# Patient Record
Sex: Male | Born: 1955 | Race: White | Hispanic: No | Marital: Married | Smoking: Former smoker
Health system: Southern US, Community
[De-identification: ages and names within clinical notes are randomized; demographics above are authoritative.]

## PROBLEM LIST (undated history)

## (undated) DIAGNOSIS — I422 Other hypertrophic cardiomyopathy: Secondary | ICD-10-CM

## (undated) DIAGNOSIS — K219 Gastro-esophageal reflux disease without esophagitis: Secondary | ICD-10-CM

## (undated) DIAGNOSIS — G4700 Insomnia, unspecified: Secondary | ICD-10-CM

## (undated) DIAGNOSIS — I10 Essential (primary) hypertension: Secondary | ICD-10-CM

## (undated) DIAGNOSIS — I499 Cardiac arrhythmia, unspecified: Secondary | ICD-10-CM

## (undated) DIAGNOSIS — M1990 Unspecified osteoarthritis, unspecified site: Secondary | ICD-10-CM

## (undated) HISTORY — PX: OTHER SURGICAL HISTORY: SHX169

## (undated) HISTORY — PX: BACK SURGERY: SHX140

## (undated) HISTORY — PX: KNEE ARTHROSCOPY: SUR90

---

## 2016-12-23 DIAGNOSIS — M5442 Lumbago with sciatica, left side: Secondary | ICD-10-CM | POA: Insufficient documentation

## 2016-12-23 DIAGNOSIS — I429 Cardiomyopathy, unspecified: Secondary | ICD-10-CM | POA: Insufficient documentation

## 2016-12-23 DIAGNOSIS — I10 Essential (primary) hypertension: Secondary | ICD-10-CM | POA: Insufficient documentation

## 2016-12-23 DIAGNOSIS — G8929 Other chronic pain: Secondary | ICD-10-CM | POA: Insufficient documentation

## 2017-01-05 ENCOUNTER — Ambulatory Visit: Payer: Self-pay | Admitting: Family Medicine

## 2017-03-30 DIAGNOSIS — R0602 Shortness of breath: Secondary | ICD-10-CM | POA: Insufficient documentation

## 2017-03-30 DIAGNOSIS — R079 Chest pain, unspecified: Secondary | ICD-10-CM

## 2017-03-30 DIAGNOSIS — R55 Syncope and collapse: Secondary | ICD-10-CM | POA: Insufficient documentation

## 2017-06-17 ENCOUNTER — Other Ambulatory Visit: Payer: Self-pay | Admitting: Student

## 2017-06-17 DIAGNOSIS — M48062 Spinal stenosis, lumbar region with neurogenic claudication: Secondary | ICD-10-CM

## 2017-06-17 DIAGNOSIS — G9519 Other vascular myelopathies: Secondary | ICD-10-CM

## 2017-06-21 ENCOUNTER — Ambulatory Visit: Payer: BLUE CROSS/BLUE SHIELD

## 2017-09-06 DIAGNOSIS — K635 Polyp of colon: Secondary | ICD-10-CM | POA: Insufficient documentation

## 2017-11-03 ENCOUNTER — Other Ambulatory Visit: Payer: Self-pay

## 2017-11-03 ENCOUNTER — Encounter
Admission: RE | Admit: 2017-11-03 | Discharge: 2017-11-03 | Disposition: A | Discharge status: Home / Self Care | Payer: BLUE CROSS/BLUE SHIELD | Source: Ambulatory Visit | Attending: Neurosurgery | Admitting: Neurosurgery

## 2017-11-03 DIAGNOSIS — Z01810 Encounter for preprocedural cardiovascular examination: Secondary | ICD-10-CM | POA: Insufficient documentation

## 2017-11-03 DIAGNOSIS — R001 Bradycardia, unspecified: Secondary | ICD-10-CM | POA: Insufficient documentation

## 2017-11-03 DIAGNOSIS — R9431 Abnormal electrocardiogram [ECG] [EKG]: Secondary | ICD-10-CM | POA: Insufficient documentation

## 2017-11-03 DIAGNOSIS — I4510 Unspecified right bundle-branch block: Secondary | ICD-10-CM | POA: Diagnosis not present

## 2017-11-03 DIAGNOSIS — Z01812 Encounter for preprocedural laboratory examination: Secondary | ICD-10-CM | POA: Diagnosis present

## 2017-11-03 HISTORY — DX: Cardiac arrhythmia, unspecified: I49.9

## 2017-11-03 HISTORY — DX: Unspecified osteoarthritis, unspecified site: M19.90

## 2017-11-03 HISTORY — DX: Gastro-esophageal reflux disease without esophagitis: K21.9

## 2017-11-03 HISTORY — DX: Insomnia, unspecified: G47.00

## 2017-11-03 HISTORY — DX: Essential (primary) hypertension: I10

## 2017-11-03 HISTORY — DX: Other hypertrophic cardiomyopathy: I42.2

## 2017-11-03 LAB — URINALYSIS, ROUTINE W REFLEX MICROSCOPIC
BACTERIA UA: NONE SEEN
BILIRUBIN URINE: NEGATIVE
Glucose, UA: NEGATIVE mg/dL
HGB URINE DIPSTICK: NEGATIVE
KETONES UR: NEGATIVE mg/dL
LEUKOCYTES UA: NEGATIVE
Nitrite: NEGATIVE
PH: 5 (ref 5.0–8.0)
PROTEIN: 30 mg/dL — AB
SQUAMOUS EPITHELIAL / LPF: NONE SEEN (ref 0–5)
Specific Gravity, Urine: 1.021 (ref 1.005–1.030)

## 2017-11-03 LAB — BASIC METABOLIC PANEL
Anion gap: 8 (ref 5–15)
BUN: 18 mg/dL (ref 8–23)
CALCIUM: 9.5 mg/dL (ref 8.9–10.3)
CO2: 25 mmol/L (ref 22–32)
CREATININE: 1.21 mg/dL (ref 0.61–1.24)
Chloride: 106 mmol/L (ref 98–111)
GFR calc Af Amer: >60 mL/min (ref >60)
GFR calc non Af Amer: >60 mL/min (ref >60)
Glucose, Bld: 129 mg/dL — ABNORMAL HIGH (ref 70–99)
Potassium: 3.9 mmol/L (ref 3.5–5.1)
Sodium: 139 mmol/L (ref 135–145)

## 2017-11-03 LAB — TYPE AND SCREEN
ABO/RH(D): A POS
Antibody Screen: NEGATIVE

## 2017-11-03 LAB — PROTIME-INR
INR: 1.06
Prothrombin Time: 13.7 seconds (ref 11.4–15.2)

## 2017-11-03 LAB — CBC
HEMATOCRIT: 41.8 % (ref 40.0–52.0)
Hemoglobin: 14.1 g/dL (ref 13.0–18.0)
MCH: 30.6 pg (ref 26.0–34.0)
MCHC: 33.8 g/dL (ref 32.0–36.0)
MCV: 90.5 fL (ref 80.0–100.0)
Platelets: 325 10*3/uL (ref 150–440)
RBC: 4.62 MIL/uL (ref 4.40–5.90)
RDW: 15.2 % — AB (ref 11.5–14.5)
WBC: 6.1 10*3/uL (ref 3.8–10.6)

## 2017-11-03 LAB — APTT: aPTT: 25 seconds (ref 24–36)

## 2017-11-03 NOTE — Patient Instructions (Signed)
Your procedure is scheduled on:Wed 9/18 Report to Day Surgery. To find out your arrival time please call [phone] between 1PM - 3PM on Tues 9/17.  Remember: Instructions that are not followed completely may result in serious medical risk,  up to and including death, or upon the discretion of your surgeon and anesthesiologist your  surgery may need to be rescheduled.     _X__ 1. Do not eat food after midnight the night before your procedure.                 No gum chewing or hard candies. You may drink clear liquids up to 2 hours                 before you are scheduled to arrive for your surgery- DO not drink clear                 liquids within 2 hours of the start of your surgery.                 Clear Liquids include:  water, apple juice without pulp, clear carbohydrate                 drink such as Clearfast of Gatorade, Black Coffee or Tea (Do not add                 anything to coffee or tea).  __X__2.  On the morning of surgery brush your teeth with toothpaste and water, you                may rinse your mouth with mouthwash if you wish.  Do not swallow any toothpaste of mouthwash.     _X__ 3.  No Alcohol for 24 hours before or after surgery.   ___ 4.  Do Not Smoke or use e-cigarettes For 24 Hours Prior to Your Surgery.                 Do not use any chewable tobacco products for at least 6 hours prior to                 surgery.  ____  5.  Bring all medications with you on the day of surgery if instructed.   __x__  6.  Notify your doctor if there is any change in your medical condition      (cold, fever, infections).     Do not wear jewelry, make-up, hairpins, clips or nail polish. Do not wear lotions, powders, or perfumes. You may wear deodorant. Do not shave 48 hours prior to surgery. Men may shave face and neck. Do not bring valuables to the hospital.    [facility] is not responsible for any belongings or valuables.  Contacts, dentures or  bridgework may not be worn into surgery. Leave your suitcase in the car. After surgery it may be brought to your room. For patients admitted to the hospital, discharge time is determined by your treatment team.   Patients discharged the day of surgery will not be allowed to drive home.   Please read over the following fact sheets that you were given:    __x__ Take these medicines the morning of surgery with A SIP OF WATER:    1. DULoxetine (CYMBALTA) 60 MG capsule  2. timolol (TIMOPTIC) 0.25 % ophthalmic solution  3. metoprolol succinate (TOPROL-XL) 100 MG 24 hr tablet  4.verapamil (CALAN-SR) 180 MG CR tablet  5.  6.  ____  Fleet Enema (as directed)   _x___ Use CHG Soap as directed  ____ Use inhalers on the day of surgery  ____ Stop metformin 2 days prior to surgery    ____ Take 1/2 of usual insulin dose the night before surgery. No insulin the morning          of surgery.   ____ Stop Coumadin/Plavix/aspirin on   _x___ Stop Anti-inflammatories  aspirin-acetaminophen-caffeine (EXCEDRIN MIGRAINE) 250-250-65 MG tablet, ibuprofen and Aleve today   __x__ Stop supplements until after surgery.  Krill oil  ____ Bring C-Pap to the hospital.

## 2017-11-09 ENCOUNTER — Ambulatory Visit: Payer: BLUE CROSS/BLUE SHIELD

## 2017-11-09 ENCOUNTER — Ambulatory Visit: Payer: BLUE CROSS/BLUE SHIELD | Admitting: Anesthesiology

## 2017-11-09 ENCOUNTER — Encounter: Payer: Self-pay | Admitting: *Deleted

## 2017-11-09 ENCOUNTER — Encounter
Admission: RE | Disposition: A | Discharge status: Home / Self Care | Payer: Self-pay | Source: Ambulatory Visit | Attending: Neurosurgery

## 2017-11-09 ENCOUNTER — Ambulatory Visit
Admission: RE | Admit: 2017-11-09 | Discharge: 2017-11-09 | Disposition: A | Discharge status: Home / Self Care | Payer: BLUE CROSS/BLUE SHIELD | Source: Ambulatory Visit | Attending: Neurosurgery | Admitting: Neurosurgery

## 2017-11-09 DIAGNOSIS — I428 Other cardiomyopathies: Secondary | ICD-10-CM | POA: Insufficient documentation

## 2017-11-09 DIAGNOSIS — M48062 Spinal stenosis, lumbar region with neurogenic claudication: Secondary | ICD-10-CM | POA: Insufficient documentation

## 2017-11-09 DIAGNOSIS — Z8249 Family history of ischemic heart disease and other diseases of the circulatory system: Secondary | ICD-10-CM | POA: Diagnosis not present

## 2017-11-09 DIAGNOSIS — Z79899 Other long term (current) drug therapy: Secondary | ICD-10-CM | POA: Insufficient documentation

## 2017-11-09 DIAGNOSIS — Z820 Family history of epilepsy and other diseases of the nervous system: Secondary | ICD-10-CM | POA: Insufficient documentation

## 2017-11-09 DIAGNOSIS — K219 Gastro-esophageal reflux disease without esophagitis: Secondary | ICD-10-CM | POA: Insufficient documentation

## 2017-11-09 DIAGNOSIS — E785 Hyperlipidemia, unspecified: Secondary | ICD-10-CM | POA: Diagnosis not present

## 2017-11-09 DIAGNOSIS — M810 Age-related osteoporosis without current pathological fracture: Secondary | ICD-10-CM | POA: Insufficient documentation

## 2017-11-09 DIAGNOSIS — G4700 Insomnia, unspecified: Secondary | ICD-10-CM | POA: Diagnosis not present

## 2017-11-09 DIAGNOSIS — M5416 Radiculopathy, lumbar region: Secondary | ICD-10-CM | POA: Diagnosis not present

## 2017-11-09 DIAGNOSIS — Z87891 Personal history of nicotine dependence: Secondary | ICD-10-CM | POA: Diagnosis not present

## 2017-11-09 DIAGNOSIS — Z809 Family history of malignant neoplasm, unspecified: Secondary | ICD-10-CM | POA: Diagnosis not present

## 2017-11-09 DIAGNOSIS — H409 Unspecified glaucoma: Secondary | ICD-10-CM | POA: Insufficient documentation

## 2017-11-09 DIAGNOSIS — M1990 Unspecified osteoarthritis, unspecified site: Secondary | ICD-10-CM | POA: Diagnosis not present

## 2017-11-09 DIAGNOSIS — I10 Essential (primary) hypertension: Secondary | ICD-10-CM | POA: Diagnosis not present

## 2017-11-09 DIAGNOSIS — Z419 Encounter for procedure for purposes other than remedying health state, unspecified: Secondary | ICD-10-CM

## 2017-11-09 DIAGNOSIS — Z800 Family history of malignant neoplasm of digestive organs: Secondary | ICD-10-CM | POA: Diagnosis not present

## 2017-11-09 DIAGNOSIS — Z823 Family history of stroke: Secondary | ICD-10-CM | POA: Insufficient documentation

## 2017-11-09 HISTORY — PX: LUMBAR LAMINECTOMY/DECOMPRESSION MICRODISCECTOMY: SHX5026

## 2017-11-09 LAB — ABO/RH: ABO/RH(D): A POS

## 2017-11-09 SURGERY — LUMBAR LAMINECTOMY/DECOMPRESSION MICRODISCECTOMY 1 LEVEL
Anesthesia: General | Site: Back

## 2017-11-09 MED ORDER — PROPOFOL 10 MG/ML IV BOLUS
INTRAVENOUS | Status: AC
  Filled 2017-11-09: qty 20

## 2017-11-09 MED ORDER — METHYLPREDNISOLONE ACETATE 40 MG/ML IJ SUSP
INTRAMUSCULAR | Status: DC | PRN
  Administered 2017-11-09: 40 mg

## 2017-11-09 MED ORDER — FAMOTIDINE 20 MG PO TABS
ORAL_TABLET | ORAL | Status: AC
  Administered 2017-11-09: 20 mg via ORAL
  Filled 2017-11-09: qty 1

## 2017-11-09 MED ORDER — LIDOCAINE HCL (PF) 2 % IJ SOLN
INTRAMUSCULAR | Status: AC
  Filled 2017-11-09: qty 10

## 2017-11-09 MED ORDER — FENTANYL CITRATE (PF) 250 MCG/5ML IJ SOLN
INTRAMUSCULAR | Status: AC
  Filled 2017-11-09: qty 5

## 2017-11-09 MED ORDER — ONDANSETRON HCL 4 MG/2ML IJ SOLN
INTRAMUSCULAR | Status: DC | PRN
  Administered 2017-11-09: 4 mg via INTRAVENOUS

## 2017-11-09 MED ORDER — LIDOCAINE HCL (CARDIAC) PF 100 MG/5ML IV SOSY
PREFILLED_SYRINGE | INTRAVENOUS | Status: DC | PRN
  Administered 2017-11-09: 60 mg via INTRAVENOUS

## 2017-11-09 MED ORDER — METHOCARBAMOL 500 MG PO TABS: 500.0000 mg | ORAL_TABLET | Freq: Four times a day (QID) | ORAL | 0 refills | Status: DC | PRN

## 2017-11-09 MED ORDER — THROMBIN 5000 UNITS EX SOLR
CUTANEOUS | Status: DC | PRN
  Administered 2017-11-09: 5000 [IU] via TOPICAL

## 2017-11-09 MED ORDER — CEFAZOLIN SODIUM-DEXTROSE 2-4 GM/100ML-% IV SOLN
INTRAVENOUS | Status: AC
  Filled 2017-11-09: qty 100

## 2017-11-09 MED ORDER — ONDANSETRON HCL 4 MG/2ML IJ SOLN
INTRAMUSCULAR | Status: AC
  Filled 2017-11-09: qty 2

## 2017-11-09 MED ORDER — FENTANYL CITRATE (PF) 100 MCG/2ML IJ SOLN
INTRAMUSCULAR | Status: DC | PRN
  Administered 2017-11-09: 50 ug via INTRAVENOUS
  Administered 2017-11-09: 100 ug via INTRAVENOUS
  Administered 2017-11-09: 50 ug via INTRAVENOUS

## 2017-11-09 MED ORDER — OXYCODONE HCL 5 MG PO TABS: 5.0000 mg | ORAL_TABLET | ORAL | 0 refills | Status: DC | PRN

## 2017-11-09 MED ORDER — CEFAZOLIN SODIUM-DEXTROSE 2-4 GM/100ML-% IV SOLN: 2.0000 g | Freq: Once | INTRAVENOUS | Status: DC

## 2017-11-09 MED ORDER — BUPIVACAINE HCL 0.5 % IJ SOLN
INTRAMUSCULAR | Status: DC | PRN
  Administered 2017-11-09: 20 mL

## 2017-11-09 MED ORDER — FENTANYL CITRATE (PF) 100 MCG/2ML IJ SOLN: 25.0000 ug | INTRAMUSCULAR | Status: DC | PRN

## 2017-11-09 MED ORDER — SODIUM CHLORIDE 0.9 % IV SOLN
INTRAVENOUS | Status: DC | PRN
  Administered 2017-11-09: 40 mL

## 2017-11-09 MED ORDER — SUCCINYLCHOLINE CHLORIDE 20 MG/ML IJ SOLN
INTRAMUSCULAR | Status: DC | PRN
  Administered 2017-11-09: 100 mg via INTRAVENOUS

## 2017-11-09 MED ORDER — LACTATED RINGERS IV SOLN
INTRAVENOUS | Status: DC
  Administered 2017-11-09: 12:00:00 via INTRAVENOUS

## 2017-11-09 MED ORDER — DEXAMETHASONE SODIUM PHOSPHATE 10 MG/ML IJ SOLN
INTRAMUSCULAR | Status: AC
  Filled 2017-11-09: qty 1

## 2017-11-09 MED ORDER — FAMOTIDINE 20 MG PO TABS
20.0000 mg | ORAL_TABLET | Freq: Once | ORAL | Status: AC
  Administered 2017-11-09: 20 mg via ORAL

## 2017-11-09 MED ORDER — BUPIVACAINE-EPINEPHRINE (PF) 0.5% -1:200000 IJ SOLN
INTRAMUSCULAR | Status: DC | PRN
  Administered 2017-11-09: 7 mL

## 2017-11-09 MED ORDER — GLYCOPYRROLATE 0.2 MG/ML IJ SOLN
INTRAMUSCULAR | Status: DC | PRN
  Administered 2017-11-09: 0.2 mg via INTRAVENOUS

## 2017-11-09 MED ORDER — OXYCODONE HCL 5 MG PO TABS: 5.0000 mg | ORAL_TABLET | Freq: Once | ORAL | Status: DC | PRN

## 2017-11-09 MED ORDER — OXYCODONE HCL 5 MG/5ML PO SOLN: 5.0000 mg | Freq: Once | ORAL | Status: DC | PRN

## 2017-11-09 MED ORDER — MIDAZOLAM HCL 2 MG/2ML IJ SOLN
INTRAMUSCULAR | Status: AC
  Filled 2017-11-09: qty 2

## 2017-11-09 MED ORDER — PHENYLEPHRINE HCL 10 MG/ML IJ SOLN
INTRAMUSCULAR | Status: DC | PRN
  Administered 2017-11-09 (×2): 50 ug via INTRAVENOUS
  Administered 2017-11-09 (×2): 100 ug via INTRAVENOUS

## 2017-11-09 MED ORDER — DEXAMETHASONE SODIUM PHOSPHATE 10 MG/ML IJ SOLN
INTRAMUSCULAR | Status: DC | PRN
  Administered 2017-11-09: 8 mg via INTRAVENOUS

## 2017-11-09 MED ORDER — SODIUM CHLORIDE 0.9 % IR SOLN
Status: DC | PRN
  Administered 2017-11-09: 1000 mL

## 2017-11-09 MED ORDER — SODIUM CHLORIDE 0.9 % IV SOLN
INTRAVENOUS | Status: DC | PRN
  Administered 2017-11-09: 30 ug/min via INTRAVENOUS

## 2017-11-09 MED ORDER — PROPOFOL 10 MG/ML IV BOLUS
INTRAVENOUS | Status: DC | PRN
  Administered 2017-11-09: 150 mg via INTRAVENOUS

## 2017-11-09 SURGICAL SUPPLY — 54 items
BUR NEURO DRILL SOFT 3.0X3.8M (BURR) ×2 IMPLANT
CANISTER SUCT 1200ML W/VALVE (MISCELLANEOUS) ×4 IMPLANT
CHLORAPREP W/TINT 26ML (MISCELLANEOUS) ×4 IMPLANT
CNTNR SPEC 2.5X3XGRAD LEK (MISCELLANEOUS) ×1
CONT SPEC 4OZ STER OR WHT (MISCELLANEOUS) ×1
CONTAINER SPEC 2.5X3XGRAD LEK (MISCELLANEOUS) ×1 IMPLANT
COUNTER NEEDLE 20/40 LG (NEEDLE) ×2 IMPLANT
COVER LIGHT HANDLE STERIS (MISCELLANEOUS) ×4 IMPLANT
CUP MEDICINE 2OZ PLAST GRAD ST (MISCELLANEOUS) ×4 IMPLANT
DERMABOND ADVANCED (GAUZE/BANDAGES/DRESSINGS) ×1
DERMABOND ADVANCED .7 DNX12 (GAUZE/BANDAGES/DRESSINGS) ×1 IMPLANT
DRAPE C-ARM 42X72 X-RAY (DRAPES) ×4 IMPLANT
DRAPE LAPAROTOMY 100X77 ABD (DRAPES) ×2 IMPLANT
DRAPE MICROSCOPE SPINE 48X150 (DRAPES) ×2 IMPLANT
DRAPE POUCH INSTRU U-SHP 10X18 (DRAPES) ×2 IMPLANT
DRAPE SURG 17X11 SM STRL (DRAPES) ×8 IMPLANT
ELECT CAUTERY BLADE TIP 2.5 (TIP) ×2
ELECT EZSTD 165MM 6.5IN (MISCELLANEOUS)
ELECT REM PT RETURN 9FT ADLT (ELECTROSURGICAL) ×2
ELECTRODE CAUTERY BLDE TIP 2.5 (TIP) ×1 IMPLANT
ELECTRODE EZSTD 165MM 6.5IN (MISCELLANEOUS) IMPLANT
ELECTRODE REM PT RTRN 9FT ADLT (ELECTROSURGICAL) ×1 IMPLANT
FRAME EYE SHIELD (PROTECTIVE WEAR) ×4 IMPLANT
GLOVE BIO SURGEON STRL SZ 6.5 (GLOVE) ×4 IMPLANT
GLOVE BIOGEL PI IND STRL 7.0 (GLOVE) ×1 IMPLANT
GLOVE BIOGEL PI INDICATOR 7.0 (GLOVE) ×1
GLOVE SURG SYN 8.5  E (GLOVE) ×3
GLOVE SURG SYN 8.5 E (GLOVE) ×3 IMPLANT
GOWN SRG XL LVL 3 NONREINFORCE (GOWNS) ×1 IMPLANT
GOWN STRL NON-REIN TWL XL LVL3 (GOWNS) ×1
GOWN STRL REUS W/TWL MED LVL3 (GOWN DISPOSABLE) ×2 IMPLANT
GRADUATE 1200CC STRL 31836 (MISCELLANEOUS) ×2 IMPLANT
IV CATH ANGIO 12GX3 LT BLUE (NEEDLE) ×2 IMPLANT
KIT SPINAL PRONEVIEW (KITS) ×2 IMPLANT
KNIFE BAYONET SHORT DISCETOMY (MISCELLANEOUS) IMPLANT
MARKER SKIN DUAL TIP RULER LAB (MISCELLANEOUS) ×2 IMPLANT
NDL SAFETY ECLIPSE 18X1.5 (NEEDLE) ×1 IMPLANT
NEEDLE HYPO 18GX1.5 SHARP (NEEDLE) ×1
NEEDLE HYPO 22GX1.5 SAFETY (NEEDLE) ×2 IMPLANT
NS IRRIG 1000ML POUR BTL (IV SOLUTION) ×2 IMPLANT
PACK LAMINECTOMY NEURO (CUSTOM PROCEDURE TRAY) ×2 IMPLANT
PAD ARMBOARD 7.5X6 YLW CONV (MISCELLANEOUS) ×2 IMPLANT
SPOGE SURGIFLO 8M (HEMOSTASIS) ×1
SPONGE SURGIFLO 8M (HEMOSTASIS) ×1 IMPLANT
SUT DVC VLOC 3-0 CL 6 P-12 (SUTURE) ×2 IMPLANT
SUT VIC AB 0 CT1 27 (SUTURE) ×1
SUT VIC AB 0 CT1 27XCR 8 STRN (SUTURE) ×1 IMPLANT
SUT VIC AB 2-0 CT1 18 (SUTURE) ×2 IMPLANT
SYR 10ML LL (SYRINGE) ×2 IMPLANT
SYR 20CC LL (SYRINGE) ×2 IMPLANT
SYR 30ML LL (SYRINGE) ×4 IMPLANT
SYR 3ML LL SCALE MARK (SYRINGE) ×2 IMPLANT
TOWEL OR 17X26 4PK STRL BLUE (TOWEL DISPOSABLE) ×6 IMPLANT
TUBING CONNECTING 10 (TUBING) ×2 IMPLANT
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
## 2017-11-09 NOTE — Anesthesia Procedure Notes (Signed)
Procedure Name: Intubation Performed by: Lance Muss, CRNA Pre-anesthesia Checklist: Patient identified, Patient being monitored, Timeout performed, Emergency Drugs available and Suction available Patient Re-evaluated:Patient Re-evaluated prior to induction Oxygen Delivery Method: Circle system utilized Preoxygenation: Pre-oxygenation with 100% oxygen Induction Type: IV induction Ventilation: Mask ventilation without difficulty Laryngoscope Size: 3 and McGraph Grade View: Grade I Tube type: Oral Tube size: 7.5 mm Number of attempts: 1 Airway Equipment and Method: Stylet,  Video-laryngoscopy and LTA kit utilized Placement Confirmation: ETT inserted through vocal cords under direct vision,  positive ETCO2 and breath sounds checked- equal and bilateral Secured at: 23 cm Tube secured with: Tape Dental Injury: Teeth and Oropharynx as per pre-operative assessment  Difficulty Due To: Difficult Airway- due to anterior larynx Future Recommendations: Recommend- induction with short-acting agent, and alternative techniques readily available Comments: First DL with MAC 3, unable to view cords. Second attempt with Mcgraph 3 with grade 1 view. +ETCO2, +BBS.

## 2017-11-09 NOTE — Op Note (Signed)
Indications: Mr. Karnes is a 62 yo male with past history of back surgery who began having symptoms or neurogenic claudication primarily impacting his R side.  He failed conservative management and elected for surgical intervention.  Findings: severe stenosis L4-5  Preoperative Diagnosis: Lumbar Stenosis with neurogenic claudication Postoperative Diagnosis: same   EBL: 50 ml IVF: 600 ml Drains: none Disposition: Extubated and Stable to PACU Complications: none  No foley catheter was placed.   Preoperative Note:   Risks of surgery discussed include: infection, bleeding, stroke, coma, death, paralysis, CSF leak, nerve/spinal cord injury, numbness, tingling, weakness, complex regional pain syndrome, recurrent stenosis and/or disc herniation, vascular injury, development of instability, neck/back pain, need for further surgery, persistent symptoms, development of deformity, and the risks of anesthesia. The patient understood these risks and agreed to proceed.  Operative Note:   1. L4-5 lumbar decompression including central laminectomy and bilateral medial facetectomies including foraminotomies  The patient was then brought from the preoperative center with intravenous access established.  The patient underwent general anesthesia and endotracheal tube intubation, and was then rotated on the [place] rail top where all pressure points were appropriately padded.  The skin was then thoroughly cleansed.  Perioperative antibiotic prophylaxis was administered.  Sterile prep and drapes were then applied and a timeout was then observed.  C-arm was brought into the field under sterile conditions and under lateral visualization the L4-5 interspace was identified and marked.  The incision was marked on the right and injected with local anesthetic. Once this was complete a 3 cm incision was opened with the use of a #10 blade knife.    The metrx tubes were sequentially advanced and confirmed in  position at L4-5. An 92mm by 46mm tube was locked in place to the bed side attachment.  The microscope was then sterilely brought into the field and muscle creep was hemostased with a bipolar and resected with a pituitary rongeur.  A Bovie extender was then used to expose the spinous process and lamina.  Careful attention was placed to not violate the facet capsule. A 3 mm matchstick drill bit was then used to make a hemi-laminotomy trough until the ligamentum flavum was exposed.  This was extended to the base of the spinous process and to the contralateral side to remove all the central bone from each side.  Once this was complete and the underlying ligamentum flavum was visualized, it was dissected with a curette and resected with Kerrison rongeurs.  Extensive ligamentum hypertrophy was noted, requiring a substantial amount of time and care for removal.  The dura was identified and palpated. The kerrison rongeur was then used to remove the medial facet bilaterally until no compression was noted.  A balltip probe was used to confirm decompression of the right L5 nerve root.  Additional attention was paid to completion of the contralateral L4-5 foraminotomy until the left L5 nerve root was completely free.  Once this was complete, L4-5 central decompression including medial facetectomy and foraminotomy was confirmed and decompression on both sides was confirmed. No CSF leak was noted.  A Depo-Medrol soaked Gelfoam pledget was placed in the defect.  The wound was copiously irrigated. The tube system was then removed under microscopic visualization and hemostasis was obtained with a bipolar.    The fascial layer was reapproximated with the use of a 0 Vicryl suture.  Subcutaneous tissue layer was reapproximated using 2-0 Vicryl suture.  3-0 monocryl was placed in subcuticular fashion. The skin was then cleansed and  Dermabond was used to close the skin opening.  Patient was then rotated back to the preoperative  bed awakened from anesthesia and taken to recovery all counts are correct in this case.  I performed the entire procedure with the assistance of Marin Olp PA as an Pensions consultant.  Raydin Bielinski K. Izora Ribas MD

## 2017-11-09 NOTE — OR Nursing (Signed)
Tolerating po''s, denies pain, ambulatory to BR for void, discharged to home.

## 2017-11-09 NOTE — Anesthesia Post-op Follow-up Note (Signed)
Anesthesia QCDR form completed.        

## 2017-11-09 NOTE — Discharge Instructions (Addendum)
°Your surgeon has performed an operation on your lumbar spine (low back) to relieve pressure on one or more nerves. Many times, patients feel better immediately after surgery and can “overdo it.” Even if you feel well, it is important that you follow these activity guidelines. If you do not let your back heal properly from the surgery, you can increase the chance of a disc herniation and/or return of your symptoms. The following are instructions to help in your recovery once you have been discharged from the hospital. ° °* Do not take anti-inflammatory medications for 3 days after surgery (naproxen [Aleve], ibuprofen [Advil, Motrin], celecoxib [Celebrex], etc.) ° °Activity  °  °No bending, lifting, or twisting (“BLT”). Avoid lifting objects heavier than 10 pounds (gallon milk jug).  Where possible, avoid household activities that involve lifting, bending, pushing, or pulling such as laundry, vacuuming, grocery shopping, and childcare. Try to arrange for help from friends and family for these activities while your back heals. ° °Increase physical activity slowly as tolerated.  Taking short walks is encouraged, but avoid strenuous exercise. Do not jog, run, bicycle, lift weights, or participate in any other exercises unless specifically allowed by your doctor. Avoid prolonged sitting, including car rides. ° °Talk to your doctor before resuming sexual activity. ° °You should not drive until cleared by your doctor. ° °Until released by your doctor, you should not return to work or school.  You should rest at home and let your body heal.  ° °You may shower two days after your surgery.  After showering, lightly dab your incision dry. Do not take a tub bath or go swimming for 3 weeks, or until approved by your doctor at your follow-up appointment. ° °If you smoke, we strongly recommend that you quit.  Smoking has been proven to interfere with normal healing in your back and will dramatically reduce the success rate of  your surgery. Please contact [facility] (800-QUIT-NOW) and use the resources at www.[facility].com for assistance in stopping smoking. ° °Surgical Incision °  °If you have a dressing on your incision, you may remove it three days after your surgery. Keep your incision area clean and dry. ° °If you have staples or stitches on your incision, you should have a follow up scheduled for removal. If you do not have staples or stitches, you will have steri-strips (small pieces of surgical tape) or Dermabond glue. The steri-strips/glue should begin to peel away within about a week (it is fine if the steri-strips fall off before then). If the strips are still in place one week after your surgery, you may gently remove them. ° °Diet          ° ° You may return to your usual diet. Be sure to stay hydrated. ° °When to Contact Us ° °Although your surgery and recovery will likely be uneventful, you may have some residual numbness, aches, and pains in your back and/or legs. This is normal and should improve in the next few weeks. ° °However, should you experience any of the following, contact us immediately: °• New numbness or weakness °• Pain that is progressively getting worse, and is not relieved by your pain medications or rest °• Bleeding, redness, swelling, pain, or drainage from surgical incision °• Chills or flu-like symptoms °• Fever greater than 101.0 F (38.3 C) °• Problems with bowel or bladder functions °• Difficulty breathing or shortness of breath °• Warmth, tenderness, or swelling in your calf ° °Contact Information °• During office hours (Monday-Friday   9 am to 5 pm), please call your physician at [phone] °• After hours and weekends, please call the [facility] at [phone] and ask for the Neurosurgery Resident On Call  °• For a life-threatening emergency, call 911 ° °AMBULATORY SURGERY  °DISCHARGE INSTRUCTIONS ° ° °1) The drugs that you were given will stay in your system until tomorrow so for the next 24  hours you should not: ° °A) Drive an automobile °B) Make any legal decisions °C) Drink any alcoholic beverage ° ° °2) You may resume regular meals tomorrow.  Today it is better to start with liquids and gradually work up to solid foods. ° °You may eat anything you prefer, but it is better to start with liquids, then soup and crackers, and gradually work up to solid foods. ° ° °3) Please notify your doctor immediately if you have any unusual bleeding, trouble breathing, redness and pain at the surgery site, drainage, fever, or pain not relieved by medication. ° ° ° °4) Additional Instructions: ° °Please contact your physician with any problems or Same Day Surgery at [phone], Monday through Friday 6 am to 4 pm, or [facility] number at [phone]. ° °

## 2017-11-09 NOTE — Discharge Summary (Signed)
Procedure: L4-5 Lumbar decompression Procedure Date: 11/09/2017 Diagnosis: Lumbar radiculopathy  History: Zachary Clements is here for L4-5 lumbar decompression for lumbar radiculopathy. Tolerated procedure well.  Denies any pain at this time including back pain or lower extremity pain.  Denies any lower extremity numbness or tingling.  Unable to determine if symptoms have resolved following procedure since they usually present while bearing weight.   Physical Exam: Vitals:   11/09/17 1124  BP: (!) 127/98  Pulse: (!) 57  Resp: 16  Temp: (!) 97.1 F (36.2 C)  SpO2: 97%    AA Ox3 Skin: no bleeding at incision site. Glue intact Strength:5/5 throughout upper and lower extremities Sensation: Intact and symmetric throughout upper and lower extremities  Data:  Recent Labs  Lab 11/03/17 1106  NA 139  K 3.9  CL 106  CO2 25  BUN 18  CREATININE 1.21  GLUCOSE 129*  CALCIUM 9.5   No results for input(s): AST, ALT, ALKPHOS in the last 168 hours.  Invalid input(s): TBILI   Recent Labs  Lab 11/03/17 1106  WBC 6.1  HGB 14.1  HCT 41.8  PLT 325   Recent Labs  Lab 11/03/17 1106  APTT 25  INR 1.06         Other tests/results: No imaging reviewed  Assessment/Plan:  Zachary Clements is POD0 s/p L4-5 lumbar decompression.  Will continue pain control with tylenol, oxycodone, robaxin, and ibuprofen. He will follow-up in clinic in 2 weeks to monitor progress.   Marin Olp PA-C Department of Neurosurgery

## 2017-11-09 NOTE — Anesthesia Preprocedure Evaluation (Addendum)
Anesthesia Evaluation  Patient identified by MRN, date of birth, ID band Patient awake    Reviewed: Allergy & Precautions, H&P , NPO status , Patient's Chart, lab work & pertinent test results  History of Anesthesia Complications Negative for: history of anesthetic complications  Airway Mallampati: III   Neck ROM: full   Comment: 2-3 FB Facial hair Dental  (+) Missing, Chipped   Pulmonary former smoker,    breath sounds clear to auscultation       Cardiovascular hypertension, (-) dysrhythmias  Rhythm:regular Rate:Normal  Hypertrophic cardiomyopathy. EF>55%. Severe LVH. Mild PHTN   Neuro/Psych negative neurological ROS  negative psych ROS   GI/Hepatic negative GI ROS, Neg liver ROS, GERD  Medicated and Controlled,  Endo/Other  negative endocrine ROS  Renal/GU      Musculoskeletal  (+) Arthritis ,   Abdominal   Peds  Hematology negative hematology ROS (+)   Anesthesia Other Findings Past Medical History: No date: Arthritis No date: Dysrhythmia No date: GERD (gastroesophageal reflux disease) No date: Hypertension No date: Hypertrophic cardiomyopathy ([place]) No date: Insomnia  Past Surgical History: No date: BACK SURGERY     Comment:  rods in back No date: BACK SURGERY     Comment:  removal of rods in back No date: colonscopy No date: KNEE ARTHROSCOPY; Right     Reproductive/Obstetrics negative OB ROS                          Anesthesia Physical Anesthesia Plan  ASA: III  Anesthesia Plan: General ETT   Post-op Pain Management:    Induction:   PONV Risk Score and Plan: 2 and Ondansetron and Dexamethasone  Airway Management Planned:   Additional Equipment:   Intra-op Plan:   Post-operative Plan:   Informed Consent: I have reviewed the patients History and Physical, chart, labs and discussed the procedure including the risks, benefits and alternatives for the proposed  anesthesia with the patient or authorized representative who has indicated his/her understanding and acceptance.   Dental Advisory Given  Plan Discussed with: Anesthesiologist, CRNA and Surgeon  Anesthesia Plan Comments:        Anesthesia Quick Evaluation

## 2017-11-09 NOTE — Transfer of Care (Signed)
Immediate Anesthesia Transfer of Care Note  Patient: Zachary Clements  Procedure(s) Performed: LUMBAR LAMINECTOMY/DECOMPRESSION MICRODISCECTOMY 1 LEVEL-4-5 (N/A Back)  Patient Location: PACU  Anesthesia Type:General  Level of Consciousness: awake and responds to stimulation  Airway & Oxygen Therapy: Patient Spontanous Breathing and Patient connected to face mask oxygen  Post-op Assessment: Report given to RN and Post -op Vital signs reviewed and stable  Post vital signs: Reviewed and stable  Last Vitals:  Vitals Value Taken Time  BP 110/87 11/09/2017  2:55 PM  Temp 36.3 C 11/09/2017  2:55 PM  Pulse 57 11/09/2017  2:55 PM  Resp 14 11/09/2017  2:55 PM  SpO2 100 % 11/09/2017  2:55 PM  Vitals shown include unvalidated device data.  Last Pain:  Vitals:   11/09/17 1124  TempSrc: Tympanic  PainSc: 2          Complications: No apparent anesthesia complications

## 2017-11-10 ENCOUNTER — Encounter: Payer: Self-pay | Admitting: Neurosurgery

## 2017-11-10 NOTE — Anesthesia Postprocedure Evaluation (Signed)
Anesthesia Post Note  Patient: Zachary Clements  Procedure(s) Performed: LUMBAR LAMINECTOMY/DECOMPRESSION MICRODISCECTOMY 1 LEVEL-4-5 (N/A Back)  Patient location during evaluation: PACU Anesthesia Type: General Level of consciousness: awake and alert Pain management: pain level controlled Vital Signs Assessment: post-procedure vital signs reviewed and stable Respiratory status: spontaneous breathing, nonlabored ventilation and respiratory function stable Cardiovascular status: blood pressure returned to baseline and stable Postop Assessment: no apparent nausea or vomiting Anesthetic complications: no     Last Vitals:  Vitals:   11/09/17 1539 11/09/17 1626  BP: 100/73 116/89  Pulse: (!) 58 (!) 59  Resp: 16 16  Temp: (!) 36.3 C   SpO2: 94% 95%    Last Pain:  Vitals:   11/09/17 1626  TempSrc:   PainSc: 0-No pain                 Durenda Hurt

## 2017-12-20 DIAGNOSIS — I421 Obstructive hypertrophic cardiomyopathy: Secondary | ICD-10-CM | POA: Insufficient documentation

## 2019-06-29 ENCOUNTER — Other Ambulatory Visit: Payer: Self-pay | Admitting: Physical Medicine & Rehabilitation

## 2019-06-29 DIAGNOSIS — M5416 Radiculopathy, lumbar region: Secondary | ICD-10-CM

## 2019-07-11 ENCOUNTER — Other Ambulatory Visit: Payer: Self-pay

## 2019-07-11 ENCOUNTER — Ambulatory Visit
Admission: RE | Admit: 2019-07-11 | Discharge: 2019-07-11 | Disposition: A | Discharge status: Home / Self Care | Payer: Medicare Other | Source: Ambulatory Visit | Attending: Physical Medicine & Rehabilitation | Admitting: Physical Medicine & Rehabilitation

## 2019-07-11 DIAGNOSIS — M5416 Radiculopathy, lumbar region: Secondary | ICD-10-CM | POA: Diagnosis not present

## 2019-07-11 IMAGING — MR MR LUMBAR SPINE WO/W CM
5 of 7 series · 29 of 48 positions shown · IV contrast (gadavist)
Comparison: Intraoperative fluoroscopic images of the lumbar spine
[DATE]

CLINICAL DATA: Lumbar radiculopathy. Additional history provided:
Patient reports 3 prior lumbar surgeries, most recently
approximately 1.5 years ago. Chronic right leg numbness for
approximately 1 year.

EXAM:
MRI LUMBAR SPINE WITHOUT AND WITH CONTRAST
TECHNIQUE: Multiplanar and multiecho pulse sequences of the lumbar spine were
obtained without and with intravenous contrast.
CONTRAST:  9mL GADAVIST GADOBUTROL 1 MMOL/ML IV SOLN

[Series 5: T2 · sagittal · 4.0mm · 0.81mm/px · 4 of 17 slices shown (1 of 2)]
[im 1/17]
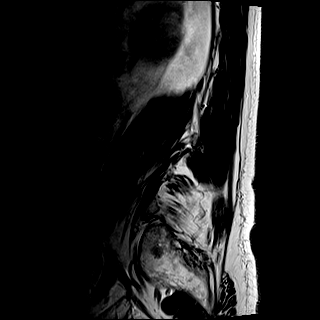
[im 6/17]
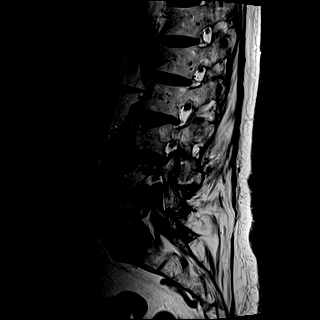
[im 11/17]
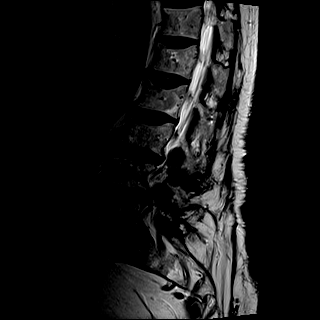
[im 17/17]
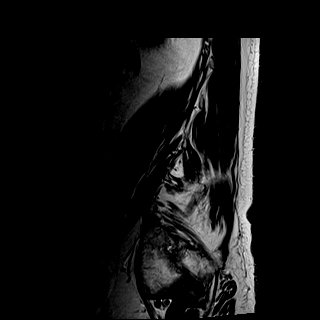

[Series 6: T1 · sagittal · 4.0mm · 0.81mm/px · 4 of 17 slices shown (1 of 2)]
[im 1/17]
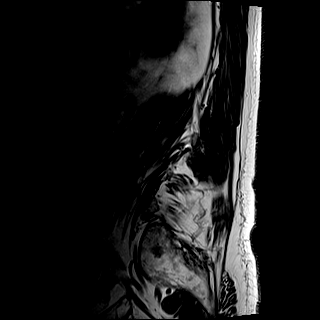
[im 6/17]
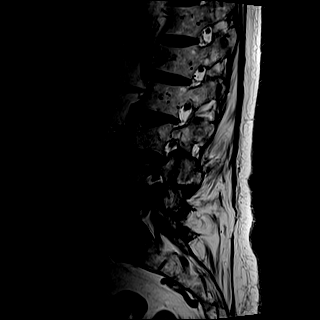
[im 11/17]
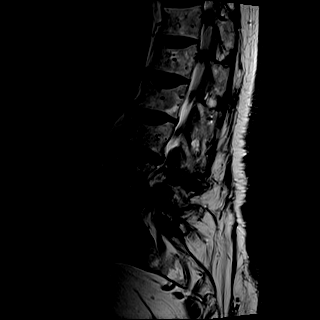
[im 17/17]
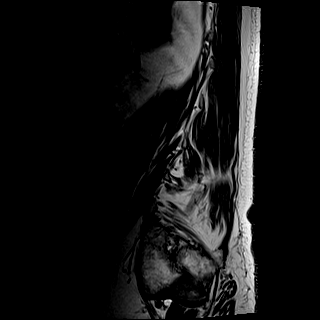

[Series 8: T2 · axial · 4.0mm · 0.78mm/px · z∈[-104,+100]mm · 8 of 36 slices shown (2 of 2)]
[im 1/36]
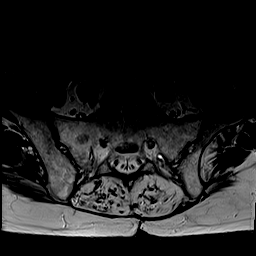
[im 4/36]
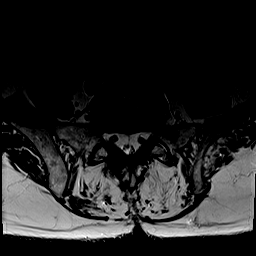
[im 12/36]
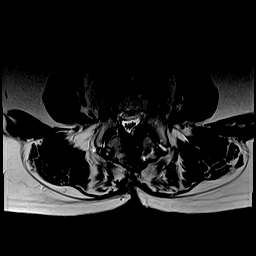
[im 16/36]
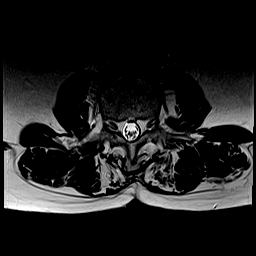
[im 20/36]
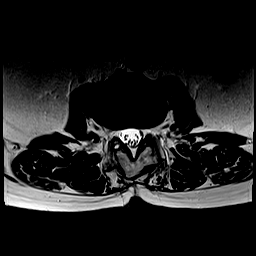
[im 24/36]
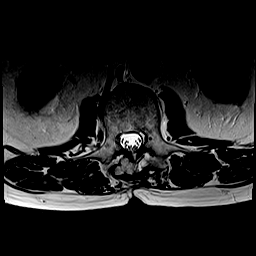
[im 32/36]
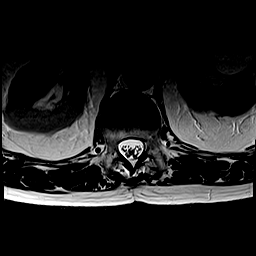
[im 36/36]
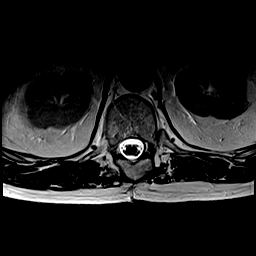

[Series 9: T1 · axial · 4.0mm · 0.39mm/px · z∈[-104,+100]mm · 8 of 36 slices shown (2 of 2)]
[im 1/36]
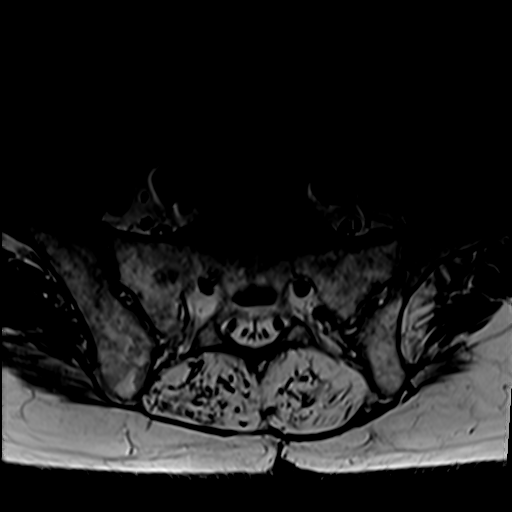
[im 4/36]
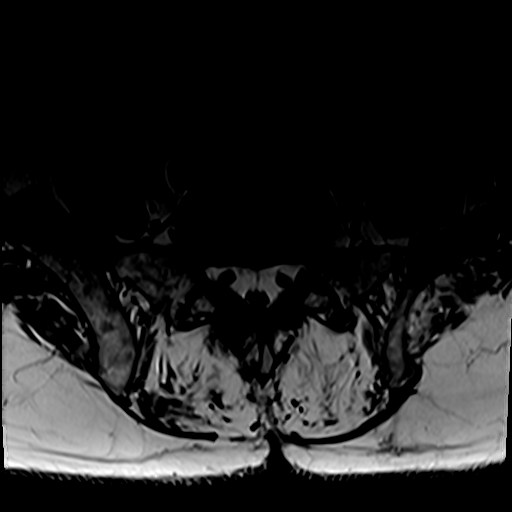
[im 12/36]
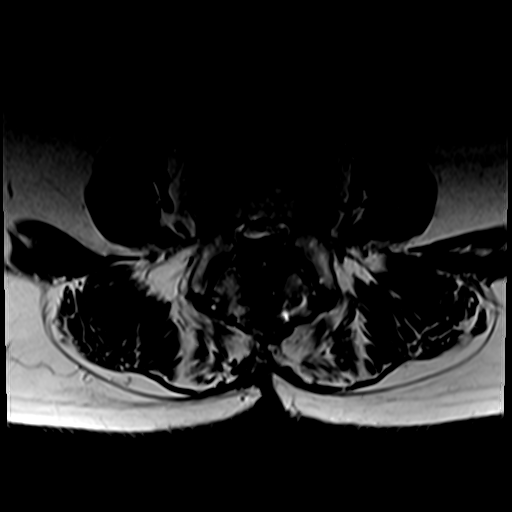
[im 16/36]
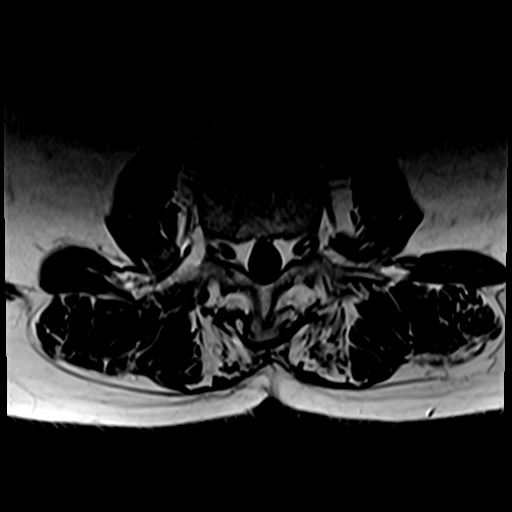
[im 20/36]
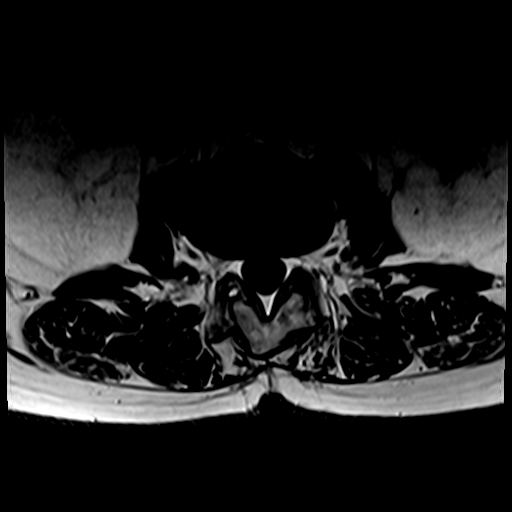
[im 24/36]
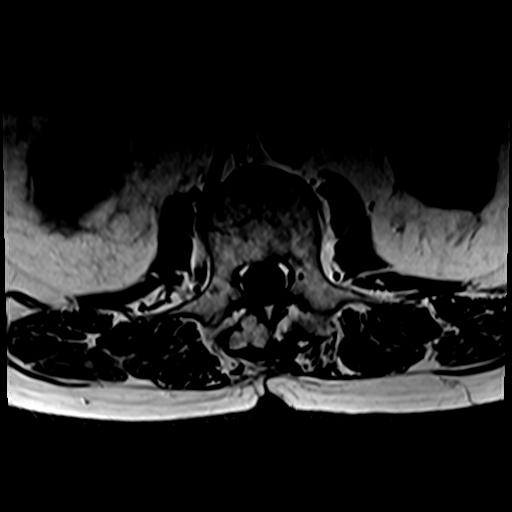
[im 32/36]
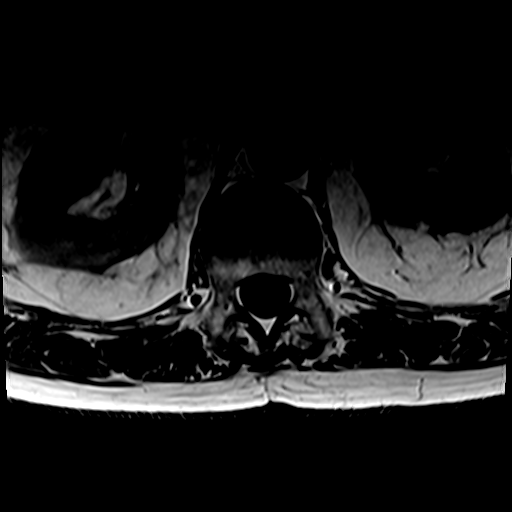
[im 36/36]
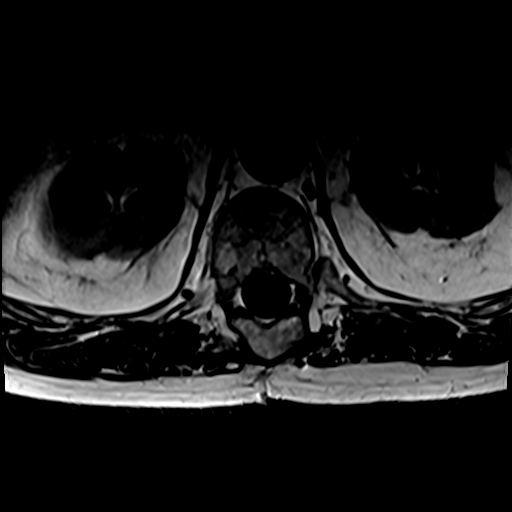

[Series 10: T1 fat-sat post-contrast · sagittal · 4.0mm · 0.81mm/px · 5 of 17 slices shown]
[im 1/17]
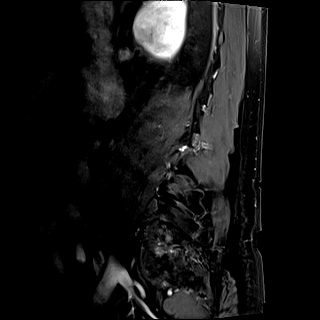
[im 5/17]
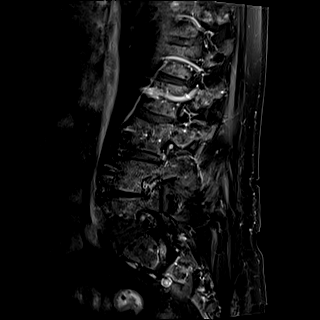
[im 9/17]
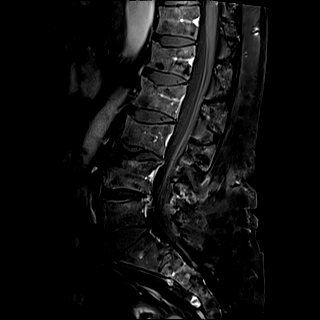
[im 13/17]
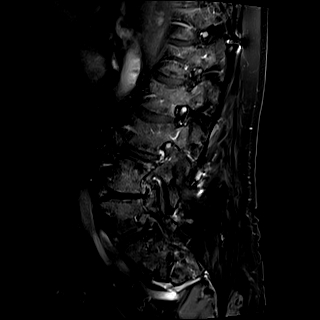
[im 17/17]
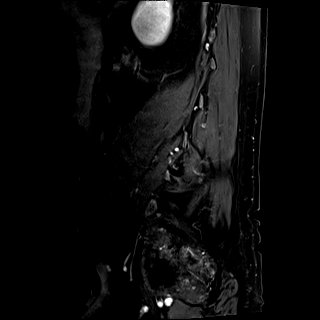

[29 of 48 positions shown; findings below may reference images not displayed]

FINDINGS: Segmentation: For the purposes of this dictation, five lumbar
vertebrae are assumed and the caudal most well-formed intervertebral
disc is designated L5-S1.

Alignment: Mild L3-L4 and L4-L5 grade 1 retrolisthesis. Trace L5-S1
grade 1 anterolisthesis.

Vertebrae: There are mild chronic compression deformities of the L1,
L2 and L3 vertebrae. Multilevel degenerative endplate irregularity.
L4-L5 degenerative endplate marrow signal and enhancement. Mild
edema and enhancement within the L5 posterior elements is also
likely degenerative.

Conus medullaris and cauda equina: Conus extends to the L1-L2 level.
No signal abnormality within the visualized distal spinal cord.

Paraspinal and other soft tissues: Incompletely assessed left renal
cyst. Atrophy of the lumbar paraspinal musculature. Postsurgical
changes to the dorsal lumbar soft tissues.

Disc levels:

Multilevel disc degeneration. Most notably there is moderate disc
degeneration at L3-L4 and L4-L5.

T12-L1: No significant disc herniation or stenosis.

L1-L2: Facet arthrosis.  No significant disc herniation or stenosis.

L2-L3: Facet arthrosis.  No significant disc herniation or stenosis.

L3-L4: Postsurgical changes to the posterior elements. Grade 1
retrolisthesis. Disc bulge. Advanced facet arthrosis with ligamentum
flavum hypertrophy. Prominence of the epidural fat. Mild bilateral
subarticular narrowing with moderate central canal narrowing. Mild
bilateral inferior neural foraminal narrowing.

L4-L5: Postsurgical changes to the posterior elements. Grade 1
retrolisthesis. Disc bulge with endplate spurring advanced facet
arthrosis with ligamentum flavum hypertrophy. Bilateral subarticular
narrowing (greater on the right) with encroachment upon the
descending right L5 nerve root (series 8, image 28). Additionally, a
12 mm ventrally projecting synovial facet cyst on the right
contributes to narrowing of the right lateral recess and may contact
the descending right L5 nerve root (series 8, image 29). Mild
central canal narrowing. Moderate/severe bilateral neural foraminal
narrowing. A 6 mm ventrally projecting synovial facet cyst on the
left contributes to left-sided neural foraminal narrowing and likely
contacts the exiting left L4 nerve root (series 5, image 15) (series
8, image 26).

L5-S1: Trace anterolisthesis. Minimal disc bulge. Facet
arthrosis/ligamentum flavum hypertrophy. No significant spinal canal
stenosis or neural foraminal narrowing.
IMPRESSION: Mild chronic L1, L2 and L3 vertebral compression deformities.

Lumbar spondylosis as outlined and most notably as follows.

At L4-L5, there are postsurgical changes to the posterior elements.
Grade 1 retrolisthesis. Multifactorial right greater than left
subarticular narrowing with encroachment upon the descending right
L5 nerve root. Additionally, a 12 mm ventrally projecting synovial
facet cyst contributes to narrowing of the right lateral recess and
may contact the descending right L5 nerve root. Mild central canal
narrowing. Moderate/severe bilateral neural foraminal narrowing. A 6
mm synovial facet cyst contributes to left sided neural foraminal
narrowing and likely contacts the exiting left L4 nerve root.

At L3-L4, there is multifactorial mild bilateral subarticular
narrowing and moderate central canal narrowing. Mild bilateral
neural foraminal narrowing.

## 2019-07-11 MED ORDER — GADOBUTROL 1 MMOL/ML IV SOLN
9.0000 mL | Freq: Once | INTRAVENOUS | Status: AC | PRN
  Administered 2019-07-11: 9 mL via INTRAVENOUS

## 2019-07-13 DIAGNOSIS — M48061 Spinal stenosis, lumbar region without neurogenic claudication: Secondary | ICD-10-CM | POA: Insufficient documentation

## 2019-09-26 ENCOUNTER — Ambulatory Visit: Payer: Medicare Other | Admitting: Dermatology

## 2019-09-26 ENCOUNTER — Other Ambulatory Visit: Payer: Self-pay

## 2019-09-26 ENCOUNTER — Encounter: Payer: Self-pay | Admitting: Dermatology

## 2019-09-26 DIAGNOSIS — L814 Other melanin hyperpigmentation: Secondary | ICD-10-CM | POA: Diagnosis not present

## 2019-09-26 DIAGNOSIS — D229 Melanocytic nevi, unspecified: Secondary | ICD-10-CM | POA: Diagnosis not present

## 2019-09-26 DIAGNOSIS — L570 Actinic keratosis: Secondary | ICD-10-CM

## 2019-09-26 DIAGNOSIS — L578 Other skin changes due to chronic exposure to nonionizing radiation: Secondary | ICD-10-CM | POA: Diagnosis not present

## 2019-09-26 NOTE — Patient Instructions (Signed)
Cryotherapy Aftercare  . Wash gently with soap and water everyday.   . Apply Vaseline and Band-Aid daily until healed.  

## 2019-09-26 NOTE — Progress Notes (Signed)
   Follow-Up Visit   Subjective  Zachary Clements is a 64 y.o. male who presents for the following: Actinic Keratosis (6 months f/u of Aks on his face).  The following portions of the chart were reviewed this encounter and updated as appropriate:  Tobacco  Allergies  Meds  Problems  Med Hx  Surg Hx  Fam Hx     Review of Systems:  No other skin or systemic complaints except as noted in HPI or Assessment and Plan.  Objective  Well appearing patient in no apparent distress; mood and affect are within normal limits.  A focused examination was performed including face, neck, chest and back and face, arms, necl . Relevant physical exam findings are noted in the Assessment and Plan.  Objective  L temple (7): Erythematous thin papules/macules with gritty scale.    Assessment & Plan  AK (actinic keratosis) (7) L temple  Destruction of lesion - L temple Complexity: simple   Destruction method: cryotherapy   Informed consent: discussed and consent obtained   Timeout:  patient name, date of birth, surgical site, and procedure verified Lesion destroyed using liquid nitrogen: Yes   Region frozen until ice ball extended beyond lesion: Yes   Outcome: patient tolerated procedure well with no complications   Post-procedure details: wound care instructions given    Actinic Damage - diffuse scaly erythematous macules with underlying dyspigmentation - Recommend daily broad spectrum sunscreen SPF 30+ to sun-exposed areas, reapply every 2 hours as needed.  - Call for new or changing lesions.  Lentigines - Scattered tan macules - Discussed due to sun exposure - Benign, observe - Call for any changes  Melanocytic Nevi - Tan-brown and/or pink-flesh-colored symmetric macules and papules - Benign appearing on exam today - Observation - Call clinic for new or changing moles - Recommend daily use of broad spectrum spf 30+ sunscreen to sun-exposed areas.   Return in about 1 year (around  09/25/2020), or AKs. IMarye Round, CMA, am acting as scribe for Sarina Ser, MD .  Documentation: I have reviewed the above documentation for accuracy and completeness, and I agree with the above.  Sarina Ser, MD

## 2019-10-19 ENCOUNTER — Inpatient Hospital Stay: Payer: Medicare Other

## 2019-10-19 ENCOUNTER — Other Ambulatory Visit: Payer: Self-pay

## 2019-10-19 ENCOUNTER — Emergency Department: Payer: Medicare Other

## 2019-10-19 ENCOUNTER — Encounter
Admission: EM | Disposition: A | Discharge status: Home / Self Care | Payer: Self-pay | Source: Home / Self Care | Attending: Internal Medicine

## 2019-10-19 ENCOUNTER — Inpatient Hospital Stay
Admission: EM | Admit: 2019-10-19 | Discharge: 2019-10-20 | DRG: 262 | Disposition: A | Discharge status: Home / Self Care | Payer: Medicare Other | Attending: Internal Medicine | Admitting: Internal Medicine

## 2019-10-19 DIAGNOSIS — H409 Unspecified glaucoma: Secondary | ICD-10-CM | POA: Diagnosis present

## 2019-10-19 DIAGNOSIS — I453 Trifascicular block: Secondary | ICD-10-CM | POA: Diagnosis present

## 2019-10-19 DIAGNOSIS — I10 Essential (primary) hypertension: Secondary | ICD-10-CM | POA: Diagnosis present

## 2019-10-19 DIAGNOSIS — E785 Hyperlipidemia, unspecified: Secondary | ICD-10-CM

## 2019-10-19 DIAGNOSIS — R001 Bradycardia, unspecified: Secondary | ICD-10-CM | POA: Diagnosis present

## 2019-10-19 DIAGNOSIS — Z79899 Other long term (current) drug therapy: Secondary | ICD-10-CM | POA: Diagnosis not present

## 2019-10-19 DIAGNOSIS — Z20822 Contact with and (suspected) exposure to covid-19: Secondary | ICD-10-CM | POA: Diagnosis present

## 2019-10-19 DIAGNOSIS — R079 Chest pain, unspecified: Secondary | ICD-10-CM | POA: Diagnosis not present

## 2019-10-19 DIAGNOSIS — G609 Hereditary and idiopathic neuropathy, unspecified: Secondary | ICD-10-CM | POA: Diagnosis present

## 2019-10-19 DIAGNOSIS — E7849 Other hyperlipidemia: Secondary | ICD-10-CM

## 2019-10-19 DIAGNOSIS — Z87891 Personal history of nicotine dependence: Secondary | ICD-10-CM

## 2019-10-19 DIAGNOSIS — I2510 Atherosclerotic heart disease of native coronary artery without angina pectoris: Secondary | ICD-10-CM | POA: Diagnosis present

## 2019-10-19 DIAGNOSIS — K219 Gastro-esophageal reflux disease without esophagitis: Secondary | ICD-10-CM | POA: Diagnosis present

## 2019-10-19 DIAGNOSIS — Z818 Family history of other mental and behavioral disorders: Secondary | ICD-10-CM

## 2019-10-19 DIAGNOSIS — Z800 Family history of malignant neoplasm of digestive organs: Secondary | ICD-10-CM

## 2019-10-19 DIAGNOSIS — I442 Atrioventricular block, complete: Principal | ICD-10-CM | POA: Diagnosis present

## 2019-10-19 DIAGNOSIS — R0602 Shortness of breath: Secondary | ICD-10-CM

## 2019-10-19 HISTORY — PX: LEFT HEART CATH AND CORONARY ANGIOGRAPHY: CATH118249

## 2019-10-19 HISTORY — PX: TEMPORARY PACEMAKER: CATH118268

## 2019-10-19 LAB — COMPREHENSIVE METABOLIC PANEL
ALT: 19 U/L (ref 0–44)
AST: 21 U/L (ref 15–41)
Albumin: 3.9 g/dL (ref 3.5–5.0)
Alkaline Phosphatase: 42 U/L (ref 38–126)
Anion gap: 7 (ref 5–15)
BUN: 26 mg/dL — ABNORMAL HIGH (ref 8–23)
CO2: 28 mmol/L (ref 22–32)
Calcium: 9.2 mg/dL (ref 8.9–10.3)
Chloride: 104 mmol/L (ref 98–111)
Creatinine, Ser: 1.21 mg/dL (ref 0.61–1.24)
GFR calc Af Amer: >60 mL/min (ref >60)
GFR calc non Af Amer: >60 mL/min (ref >60)
Glucose, Bld: 116 mg/dL — ABNORMAL HIGH (ref 70–99)
Potassium: 4.1 mmol/L (ref 3.5–5.1)
Sodium: 139 mmol/L (ref 135–145)
Total Bilirubin: 0.7 mg/dL (ref 0.3–1.2)
Total Protein: 7 g/dL (ref 6.5–8.1)

## 2019-10-19 LAB — CBC WITH DIFFERENTIAL/PLATELET
Abs Immature Granulocytes: 0.03 10*3/uL (ref 0.00–0.07)
Basophils Absolute: 0.1 10*3/uL (ref 0.0–0.1)
Basophils Relative: 1 %
Eosinophils Absolute: 0.3 10*3/uL (ref 0.0–0.5)
Eosinophils Relative: 4 %
HCT: 40.4 % (ref 39.0–52.0)
Hemoglobin: 13.4 g/dL (ref 13.0–17.0)
Immature Granulocytes: 0 %
Lymphocytes Relative: 28 %
Lymphs Abs: 2.2 10*3/uL (ref 0.7–4.0)
MCH: 32.3 pg (ref 26.0–34.0)
MCHC: 33.2 g/dL (ref 30.0–36.0)
MCV: 97.3 fL (ref 80.0–100.0)
Monocytes Absolute: 0.8 10*3/uL (ref 0.1–1.0)
Monocytes Relative: 10 %
Neutro Abs: 4.6 10*3/uL (ref 1.7–7.7)
Neutrophils Relative %: 57 %
Platelets: 306 10*3/uL (ref 150–400)
RBC: 4.15 MIL/uL — ABNORMAL LOW (ref 4.22–5.81)
RDW: 12.9 % (ref 11.5–15.5)
WBC: 8.1 10*3/uL (ref 4.0–10.5)
nRBC: 0 % (ref 0.0–0.2)

## 2019-10-19 LAB — SARS CORONAVIRUS 2 BY RT PCR (HOSPITAL ORDER, PERFORMED IN ~~LOC~~ HOSPITAL LAB): SARS Coronavirus 2: NEGATIVE

## 2019-10-19 LAB — CBC
HCT: 37.2 % — ABNORMAL LOW (ref 39.0–52.0)
Hemoglobin: 13 g/dL (ref 13.0–17.0)
MCH: 33.3 pg (ref 26.0–34.0)
MCHC: 34.9 g/dL (ref 30.0–36.0)
MCV: 95.4 fL (ref 80.0–100.0)
Platelets: 293 10*3/uL (ref 150–400)
RBC: 3.9 MIL/uL — ABNORMAL LOW (ref 4.22–5.81)
RDW: 13 % (ref 11.5–15.5)
WBC: 7.1 10*3/uL (ref 4.0–10.5)
nRBC: 0 % (ref 0.0–0.2)

## 2019-10-19 LAB — BASIC METABOLIC PANEL
Anion gap: 8 (ref 5–15)
BUN: 25 mg/dL — ABNORMAL HIGH (ref 8–23)
CO2: 26 mmol/L (ref 22–32)
Calcium: 8.8 mg/dL — ABNORMAL LOW (ref 8.9–10.3)
Chloride: 103 mmol/L (ref 98–111)
Creatinine, Ser: 1.03 mg/dL (ref 0.61–1.24)
GFR calc Af Amer: >60 mL/min (ref >60)
GFR calc non Af Amer: >60 mL/min (ref >60)
Glucose, Bld: 110 mg/dL — ABNORMAL HIGH (ref 70–99)
Potassium: 4.6 mmol/L (ref 3.5–5.1)
Sodium: 137 mmol/L (ref 135–145)

## 2019-10-19 LAB — FIBRIN DERIVATIVES D-DIMER (ARMC ONLY): Fibrin derivatives D-dimer (ARMC): 1070.34 ng/mL (FEU) — ABNORMAL HIGH (ref 0.00–499.00)

## 2019-10-19 LAB — TROPONIN I (HIGH SENSITIVITY)
Troponin I (High Sensitivity): 10 ng/L (ref <18)
Troponin I (High Sensitivity): 15 ng/L (ref <18)
Troponin I (High Sensitivity): 219 ng/L (ref <18)

## 2019-10-19 LAB — BRAIN NATRIURETIC PEPTIDE: B Natriuretic Peptide: 578.6 pg/mL — ABNORMAL HIGH (ref 0.0–100.0)

## 2019-10-19 LAB — GLUCOSE, CAPILLARY: Glucose-Capillary: 151 mg/dL — ABNORMAL HIGH (ref 70–99)

## 2019-10-19 LAB — MRSA PCR SCREENING: MRSA by PCR: NEGATIVE

## 2019-10-19 LAB — MAGNESIUM: Magnesium: 2.2 mg/dL (ref 1.7–2.4)

## 2019-10-19 LAB — HIV ANTIBODY (ROUTINE TESTING W REFLEX): HIV Screen 4th Generation wRfx: NONREACTIVE

## 2019-10-19 IMAGING — DX DG CHEST 1V PORT
1 series · 1 of 1 positions shown · non-contrast
Comparison: [DATE]

CLINICAL DATA: Complete heart block

EXAM:
PORTABLE CHEST 1 VIEW

[chest ap]
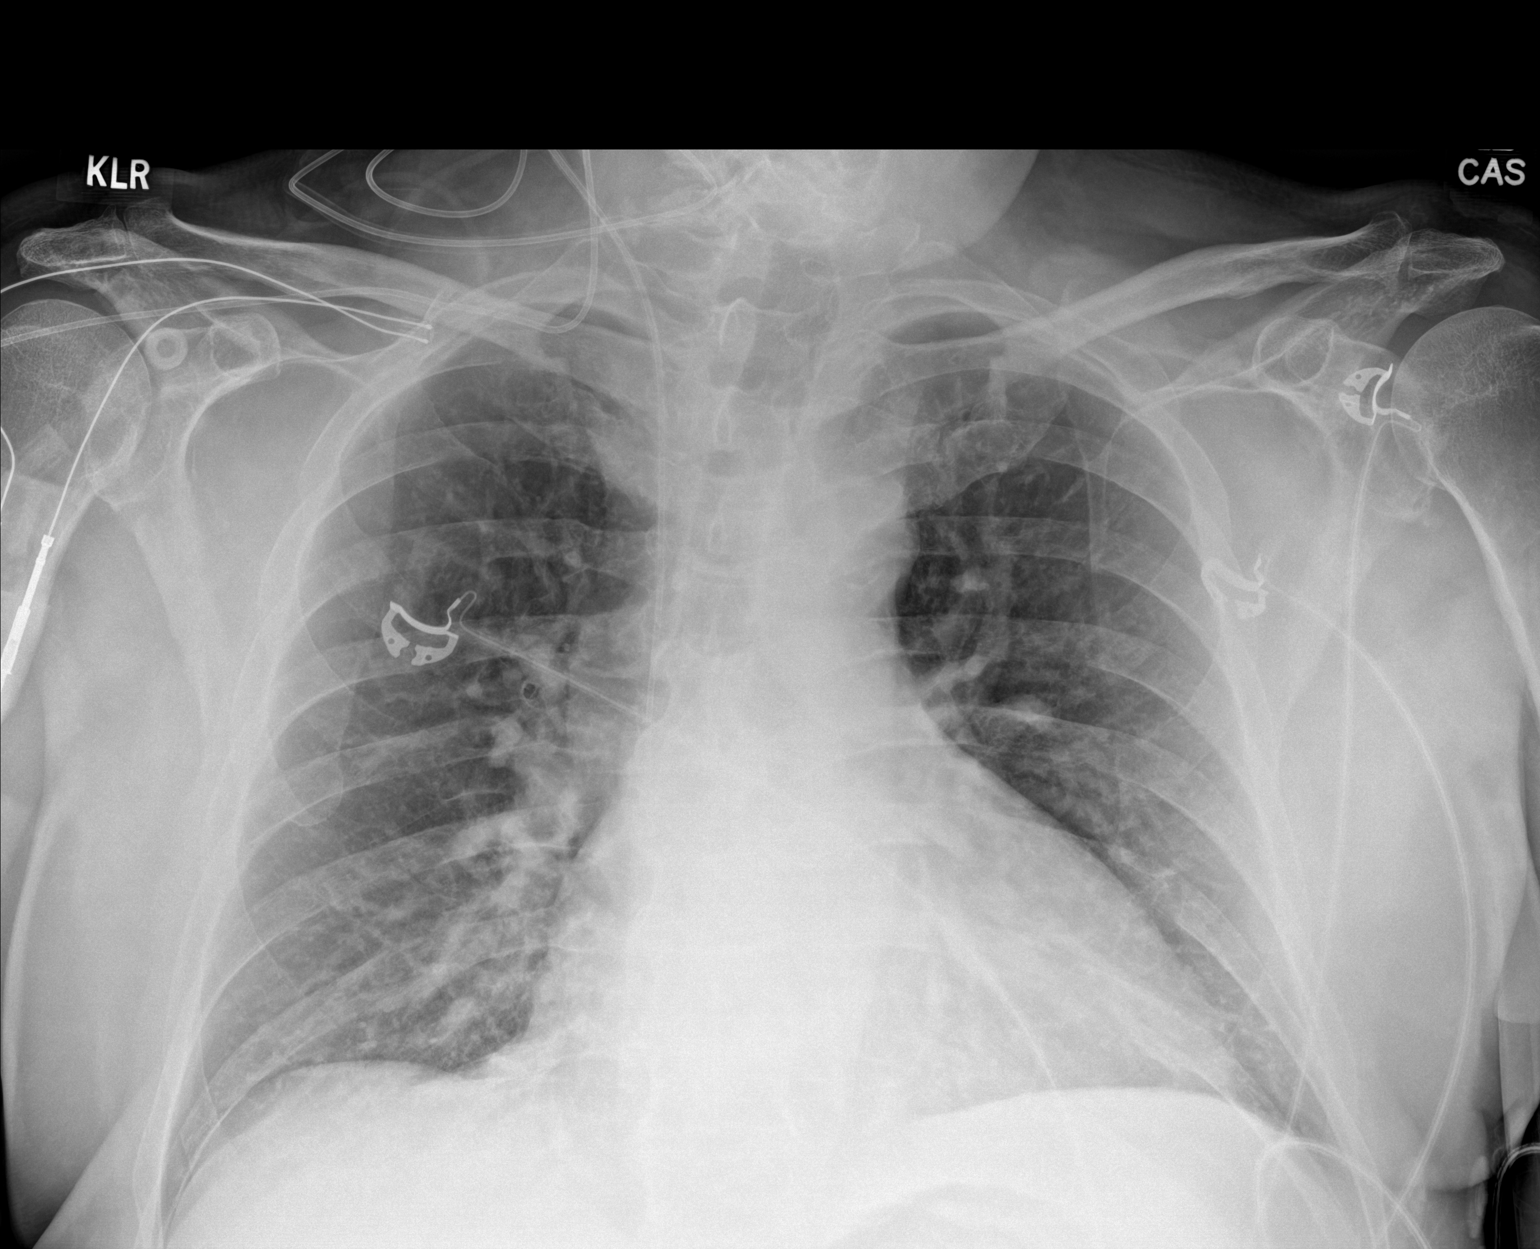

[1 of 1 positions shown; findings below may reference images not displayed]

FINDINGS: Right central line tip is at the cavoatrial junction. No
pneumothorax. Cardiomegaly, mild vascular congestion. No confluent
opacities, effusions or edema. No acute bony abnormality.
IMPRESSION: Mild cardiomegaly, vascular congestion.

## 2019-10-19 IMAGING — DX DG CHEST 1V
1 series · 1 of 1 positions shown · non-contrast
Comparison: None available.

CLINICAL DATA: Initial evaluation for acute shortness of breath.

EXAM:
CHEST  1 VIEW

[chest ap]
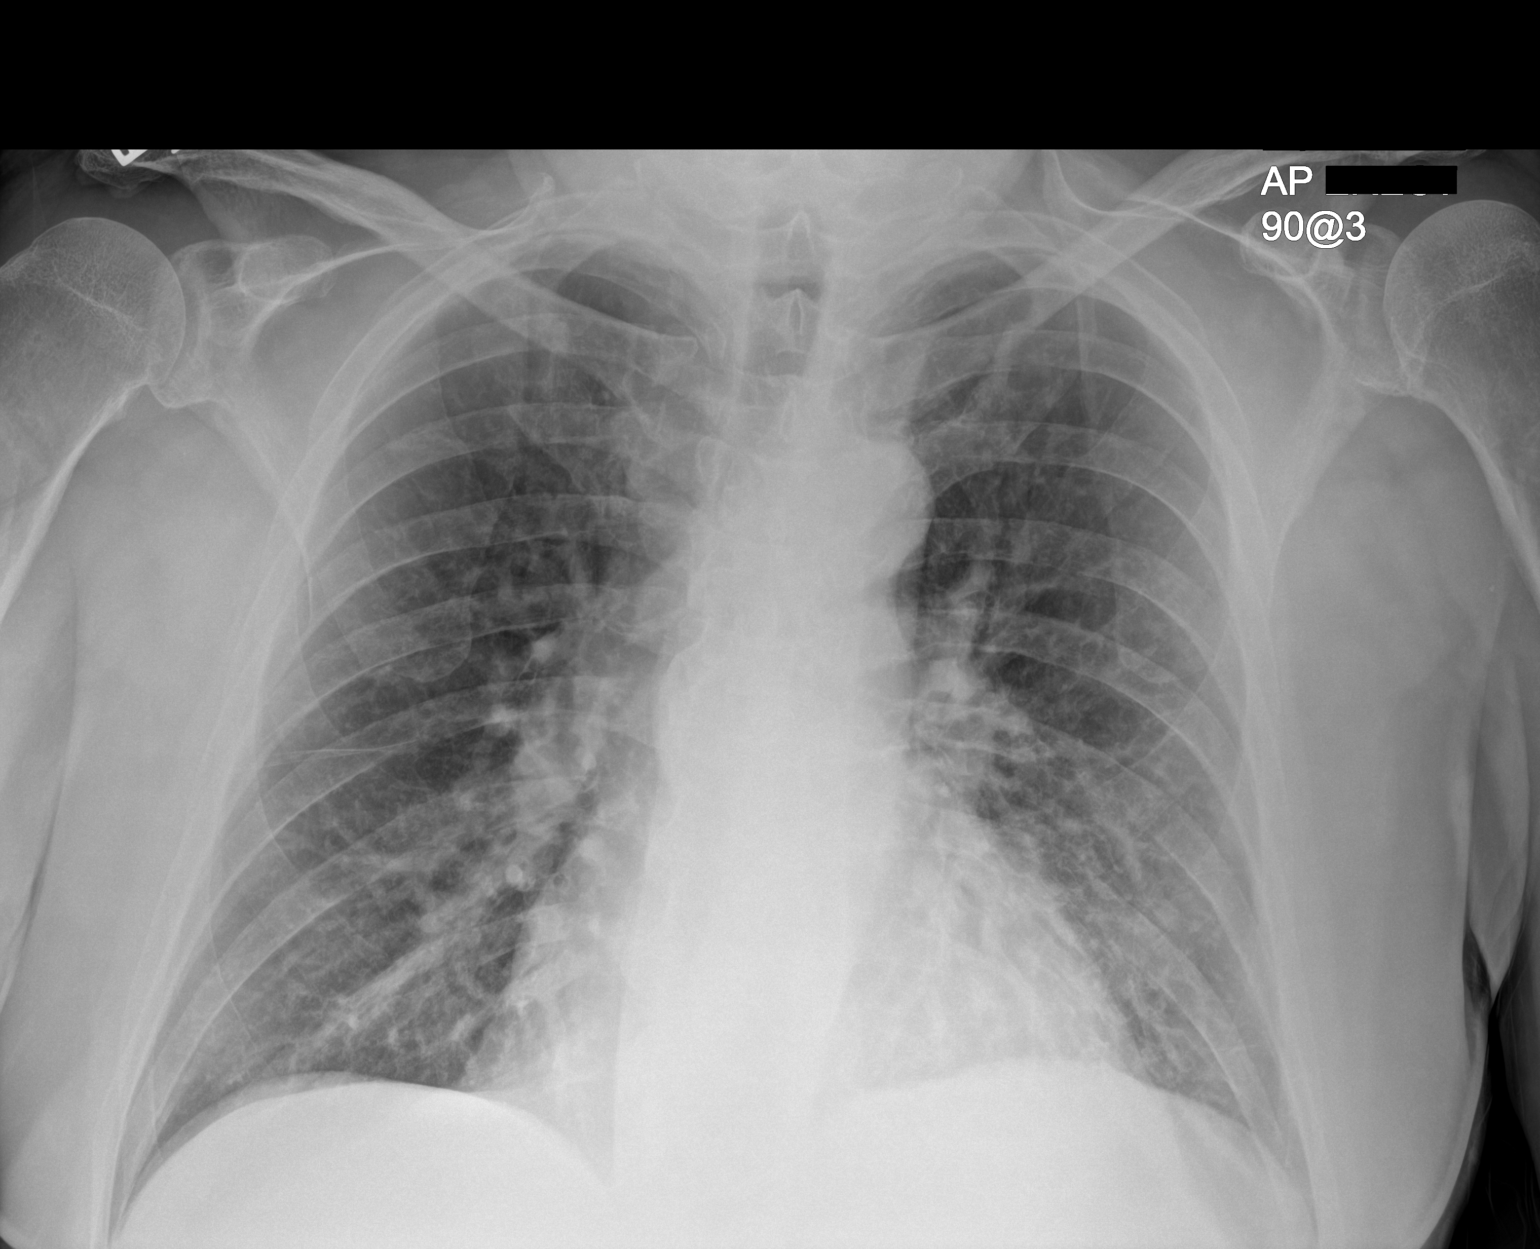

[1 of 1 positions shown; findings below may reference images not displayed]

FINDINGS: Mild cardiomegaly.  Mediastinal silhouette within normal limits.

Lungs mildly hypoinflated with elevation left hemidiaphragm. Mild
streaky left basilar atelectasis. Perihilar vascular congestion
without overt pulmonary edema. No pleural effusion. No airspace
consolidation. No pneumothorax.

No acute osseous finding.
IMPRESSION: 1. Cardiomegaly with perihilar vascular congestion without overt
pulmonary edema.
2. Elevation of the left hemidiaphragm with associated mild left
basilar atelectasis.

## 2019-10-19 IMAGING — DX DG CHEST 1V PORT
1 series · 2 of 2 positions shown · non-contrast
Comparison: [DATE] study obtained earlier in the day

CLINICAL DATA: Chest pain

EXAM:
PORTABLE CHEST 1 VIEW

[Series 1: chest ap · 0.14mm/px · 2 of 2 slices shown]
[im 1/2]
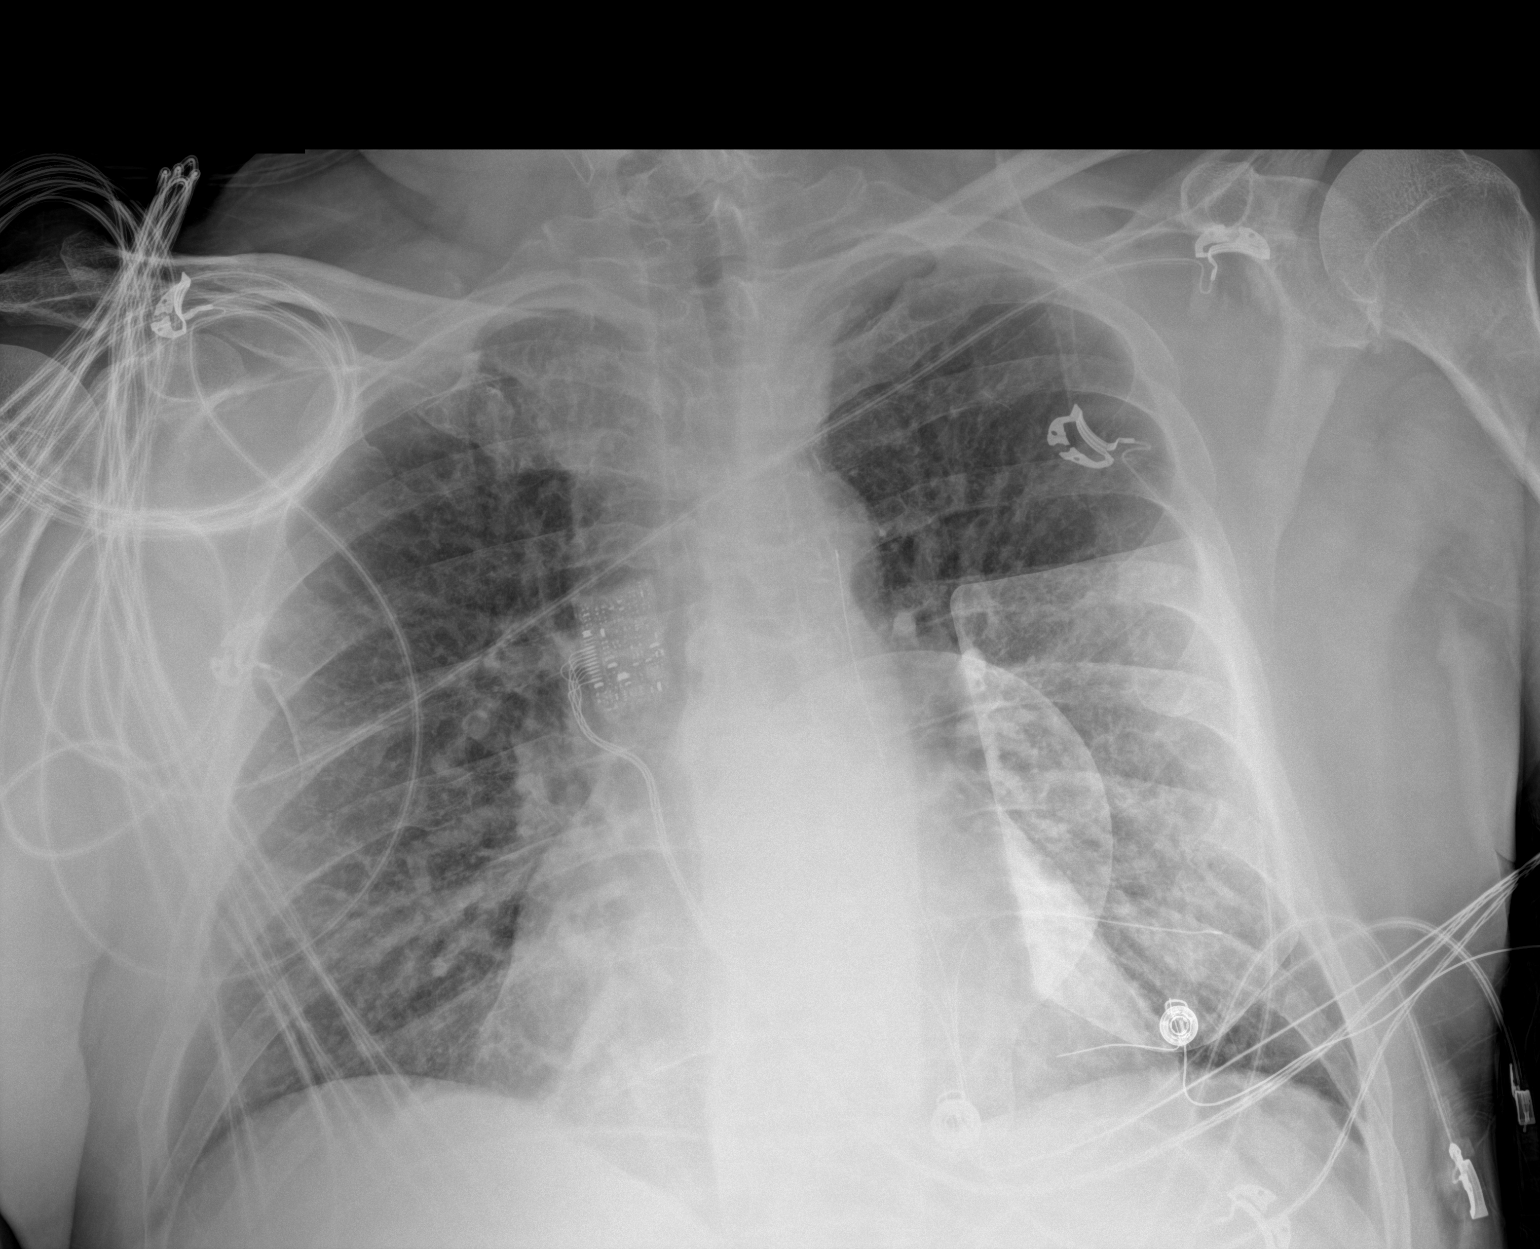
[im 2/2]
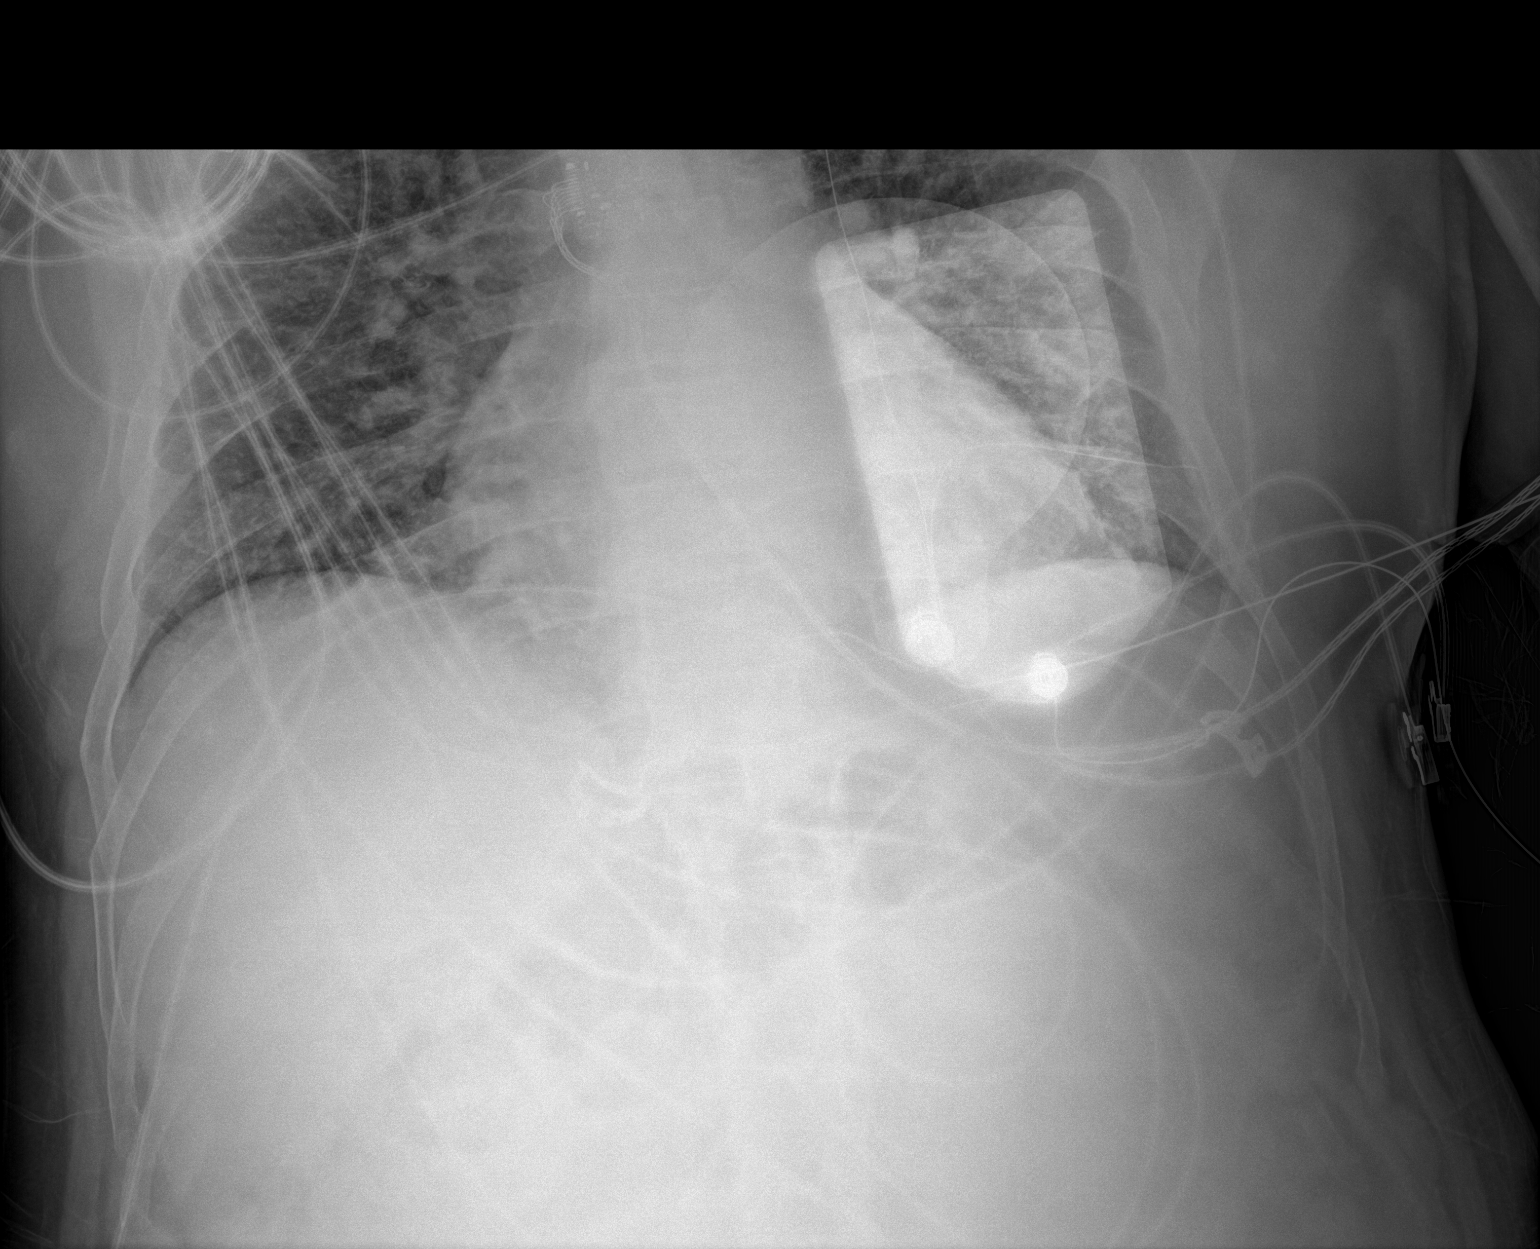

[2 of 2 positions shown; findings below may reference images not displayed]

FINDINGS: There is no edema or airspace opacity. Heart is borderline prominent
with mild pulmonary venous hypertension. No evident adenopathy. No
pneumothorax. No bone lesions.
IMPRESSION: Borderline cardiomegaly with a degree of pulmonary vascular
congestion. Lungs clear.

## 2019-10-19 SURGERY — LEFT HEART CATH AND CORONARY ANGIOGRAPHY
Anesthesia: Moderate Sedation

## 2019-10-19 MED ORDER — KETOROLAC TROMETHAMINE 30 MG/ML IJ SOLN
30.0000 mg | Freq: Four times a day (QID) | INTRAMUSCULAR | Status: DC
  Administered 2019-10-19: 30 mg via INTRAVENOUS
  Filled 2019-10-19: qty 1

## 2019-10-19 MED ORDER — ATROPINE SULFATE 1 MG/10ML IJ SOSY
1.0000 mg | PREFILLED_SYRINGE | Freq: Once | INTRAMUSCULAR | Status: AC
  Administered 2019-10-19: 1 mg via INTRAVENOUS
  Filled 2019-10-19: qty 10

## 2019-10-19 MED ORDER — LABETALOL HCL 5 MG/ML IV SOLN: 10.0000 mg | INTRAVENOUS | Status: AC | PRN

## 2019-10-19 MED ORDER — MAGNESIUM HYDROXIDE 400 MG/5ML PO SUSP: 30.0000 mL | Freq: Every day | ORAL | Status: DC | PRN

## 2019-10-19 MED ORDER — SODIUM CHLORIDE 0.9% FLUSH: 3.0000 mL | Freq: Two times a day (BID) | INTRAVENOUS | Status: DC

## 2019-10-19 MED ORDER — MIDAZOLAM HCL 2 MG/2ML IJ SOLN
INTRAMUSCULAR | Status: AC
  Filled 2019-10-19: qty 2

## 2019-10-19 MED ORDER — SODIUM CHLORIDE 0.9% FLUSH
3.0000 mL | INTRAVENOUS | Status: DC | PRN
  Administered 2019-10-19: 3 mL via INTRAVENOUS

## 2019-10-19 MED ORDER — LIDOCAINE VISCOUS HCL 2 % MT SOLN
15.0000 mL | Freq: Once | OROMUCOSAL | Status: AC
  Administered 2019-10-19: 15 mL via OROMUCOSAL
  Filled 2019-10-19: qty 15

## 2019-10-19 MED ORDER — CHLORHEXIDINE GLUCONATE CLOTH 2 % EX PADS
6.0000 | MEDICATED_PAD | Freq: Every day | CUTANEOUS | Status: DC
  Administered 2019-10-19: 6 via TOPICAL

## 2019-10-19 MED ORDER — ONDANSETRON HCL 4 MG/2ML IJ SOLN: 4.0000 mg | Freq: Four times a day (QID) | INTRAMUSCULAR | Status: DC | PRN

## 2019-10-19 MED ORDER — SODIUM CHLORIDE 0.9 % IV SOLN: INTRAVENOUS | Status: DC

## 2019-10-19 MED ORDER — IOHEXOL 300 MG/ML  SOLN
INTRAMUSCULAR | Status: DC | PRN
  Administered 2019-10-19: 55 mL

## 2019-10-19 MED ORDER — ACETAMINOPHEN 500 MG PO TABS
1000.0000 mg | ORAL_TABLET | Freq: Four times a day (QID) | ORAL | Status: DC
  Administered 2019-10-19 (×2): 1000 mg via ORAL
  Filled 2019-10-19 (×2): qty 2

## 2019-10-19 MED ORDER — ATROPINE SULFATE 1 MG/10ML IJ SOSY
0.5000 mg | PREFILLED_SYRINGE | Freq: Once | INTRAMUSCULAR | Status: AC
  Administered 2019-10-19: 0.5 mg via INTRAVENOUS

## 2019-10-19 MED ORDER — KETOROLAC TROMETHAMINE 30 MG/ML IJ SOLN
15.0000 mg | Freq: Four times a day (QID) | INTRAMUSCULAR | Status: DC
  Administered 2019-10-19 – 2019-10-20 (×2): 15 mg via INTRAVENOUS
  Filled 2019-10-19 (×3): qty 1

## 2019-10-19 MED ORDER — PHENYLEPHRINE HCL-NACL 10-0.9 MG/250ML-% IV SOLN
0.0000 ug/min | INTRAVENOUS | Status: DC
  Administered 2019-10-19: 50 ug/min via INTRAVENOUS
  Filled 2019-10-19: qty 250

## 2019-10-19 MED ORDER — ENOXAPARIN SODIUM 40 MG/0.4ML ~~LOC~~ SOLN
40.0000 mg | SUBCUTANEOUS | Status: DC
  Administered 2019-10-19: 40 mg via SUBCUTANEOUS
  Filled 2019-10-19 (×2): qty 0.4

## 2019-10-19 MED ORDER — ATROPINE SULFATE 1 MG/10ML IJ SOSY: 0.5000 mg | PREFILLED_SYRINGE | Freq: Once | INTRAMUSCULAR | Status: AC

## 2019-10-19 MED ORDER — ALUM & MAG HYDROXIDE-SIMETH 200-200-20 MG/5ML PO SUSP
30.0000 mL | Freq: Once | ORAL | Status: AC
  Administered 2019-10-19: 30 mL via ORAL
  Filled 2019-10-19: qty 30

## 2019-10-19 MED ORDER — DOPAMINE-DEXTROSE 3.2-5 MG/ML-% IV SOLN
5.0000 ug/kg/min | INTRAVENOUS | Status: DC
  Administered 2019-10-19: 5 ug/kg/min via INTRAVENOUS
  Filled 2019-10-19: qty 250

## 2019-10-19 MED ORDER — FENTANYL CITRATE (PF) 100 MCG/2ML IJ SOLN
INTRAMUSCULAR | Status: AC
  Filled 2019-10-19: qty 2

## 2019-10-19 MED ORDER — ACETAMINOPHEN 325 MG PO TABS: 650.0000 mg | ORAL_TABLET | Freq: Four times a day (QID) | ORAL | Status: DC | PRN

## 2019-10-19 MED ORDER — HEPARIN SODIUM (PORCINE) 1000 UNIT/ML IJ SOLN
INTRAMUSCULAR | Status: AC
  Filled 2019-10-19: qty 1

## 2019-10-19 MED ORDER — ATROPINE SULFATE 1 MG/10ML IJ SOSY: 0.5000 mg | PREFILLED_SYRINGE | INTRAMUSCULAR | Status: DC | PRN

## 2019-10-19 MED ORDER — VERAPAMIL HCL 2.5 MG/ML IV SOLN
INTRAVENOUS | Status: DC | PRN
  Administered 2019-10-19: 2.5 mg via INTRA_ARTERIAL

## 2019-10-19 MED ORDER — SODIUM CHLORIDE 0.9 % IV SOLN
25.0000 ug/min | INTRAVENOUS | Status: DC
  Filled 2019-10-19: qty 1

## 2019-10-19 MED ORDER — ONDANSETRON HCL 4 MG PO TABS: 4.0000 mg | ORAL_TABLET | Freq: Four times a day (QID) | ORAL | Status: DC | PRN

## 2019-10-19 MED ORDER — ASPIRIN 81 MG PO CHEW
CHEWABLE_TABLET | ORAL | Status: AC
  Administered 2019-10-19: 324 mg via ORAL
  Filled 2019-10-19: qty 4

## 2019-10-19 MED ORDER — HYDRALAZINE HCL 20 MG/ML IJ SOLN: 10.0000 mg | INTRAMUSCULAR | Status: AC | PRN

## 2019-10-19 MED ORDER — SODIUM CHLORIDE 0.9 % IV SOLN: 250.0000 mL | INTRAVENOUS | Status: DC | PRN

## 2019-10-19 MED ORDER — ASPIRIN 81 MG PO CHEW: 324.0000 mg | CHEWABLE_TABLET | Freq: Once | ORAL | Status: AC

## 2019-10-19 MED ORDER — ACETAMINOPHEN 650 MG RE SUPP: 650.0000 mg | Freq: Four times a day (QID) | RECTAL | Status: DC | PRN

## 2019-10-19 MED ORDER — TRAZODONE HCL 50 MG PO TABS: 25.0000 mg | ORAL_TABLET | Freq: Every evening | ORAL | Status: DC | PRN

## 2019-10-19 MED ORDER — SODIUM CHLORIDE 0.9 % IV SOLN: 250.0000 mL | INTRAVENOUS | Status: DC

## 2019-10-19 MED ORDER — VERAPAMIL HCL 2.5 MG/ML IV SOLN
INTRAVENOUS | Status: AC
  Filled 2019-10-19: qty 2

## 2019-10-19 MED ORDER — HEPARIN SODIUM (PORCINE) 1000 UNIT/ML IJ SOLN
INTRAMUSCULAR | Status: DC | PRN
  Administered 2019-10-19: 4500 [IU] via INTRAVENOUS

## 2019-10-19 MED ORDER — HEPARIN (PORCINE) IN NACL 1000-0.9 UT/500ML-% IV SOLN
INTRAVENOUS | Status: AC
  Filled 2019-10-19: qty 1000

## 2019-10-19 MED ORDER — ATROPINE SULFATE 1 MG/10ML IJ SOSY
PREFILLED_SYRINGE | INTRAMUSCULAR | Status: AC
  Administered 2019-10-19: 0.5 mg via INTRAVENOUS
  Filled 2019-10-19: qty 10

## 2019-10-19 MED ORDER — PHENYLEPHRINE HCL (PRESSORS) 10 MG/ML IV SOLN
INTRAVENOUS | Status: AC
  Filled 2019-10-19: qty 1

## 2019-10-19 MED ORDER — SODIUM CHLORIDE 0.9% FLUSH
3.0000 mL | Freq: Two times a day (BID) | INTRAVENOUS | Status: DC
  Administered 2019-10-19: 3 mL via INTRAVENOUS

## 2019-10-19 SURGICAL SUPPLY — 15 items
CABLE ADAPT PACING TEMP 12FT (ADAPTER) ×1 IMPLANT
CANNULA 5F STIFF (CANNULA) ×1 IMPLANT
CATH INFINITI 5 FR JL3.5 (CATHETERS) ×1 IMPLANT
CATH INFINITI JR4 5F (CATHETERS) ×1 IMPLANT
CATH-GARD ARROW CATH SHIELD (MISCELLANEOUS) ×2
DEVICE RAD TR BAND REGULAR (VASCULAR PRODUCTS) ×1 IMPLANT
GLIDESHEATH SLEND SS 6F .021 (SHEATH) ×1 IMPLANT
GUIDEWIRE INQWIRE 1.5J.035X260 (WIRE) IMPLANT
INQWIRE 1.5J .035X260CM (WIRE) ×2
KIT MANI 3VAL PERCEP (MISCELLANEOUS) ×2 IMPLANT
PACK CARDIAC CATH (CUSTOM PROCEDURE TRAY) ×2 IMPLANT
SHEATH AVANTI 6FR X 11CM (SHEATH) ×1 IMPLANT
SHIELD CATHGARD ARROW (MISCELLANEOUS) IMPLANT
SUT SILK 0 FSL (SUTURE) ×1 IMPLANT
WIRE PACING TEMP ST TIP 5 (CATHETERS) ×1 IMPLANT

## 2019-10-19 NOTE — Interval H&P Note (Signed)
History and Physical Interval Note:  10/19/2019 10:56 AM  Zachary Clements  has presented today for surgery, with the diagnosis of complete heart block and chest pain.  The various methods of treatment have been discussed with the patient and family. After consideration of risks, benefits and other options for treatment, the patient has consented to  Procedure(s): LEFT HEART CATH AND CORONARY ANGIOGRAPHY (N/A) and temporary transvenous pacemaker placement as a surgical intervention.  The patient's history has been reviewed, patient examined, no change in status, stable for surgery.  I have reviewed the patient's chart and labs.  Questions were answered to the patient's satisfaction.    Cath Lab Visit (complete for each Cath Lab visit)  Clinical Evaluation Leading to the Procedure:   ACS: Yes.    Non-ACS:  N/A  [name]

## 2019-10-19 NOTE — ED Provider Notes (Signed)
Patient seems to be getting more agitated and restless.  Repeat chest x-ray looks more like CHF than the previous one.  I stop the fluids.  He is already on 20 mics of dopamine.  Consider changing him to Levophed.  Additionally I will start some BiPAP as his sats are dropping into the high 80s on 100% nonrebreather.  Repeat EKG showed return of the ST segment depression in many of the chest leads.  I will get a posterior EKG.   Nena Polio, MD 10/19/19 1021

## 2019-10-19 NOTE — Consult Note (Signed)
Interventional Cardiology consultation:   Patient ID: Zachary Clements MRN: [id number]; DOB: 01-Jul-1955  Admit date: 10/19/2019 Date of Consult: 10/19/2019  Primary Care Provider: Derinda Late, MD [facility] Cardiologist: Miquel Dunn, MD PhD [facility] Electrophysiologist:  None    Patient Profile:   Zachary Clements is a 64 y.o. 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 Nehemiah Massed.  History of Present Illness:   Zachary Clements patient presented to the emergency department yesterday with worsening weakness and dizziness.  He was found to be bradycardic with high-grade AV block, including periods of third-degree AV block.  He was last seen by Dr. Saralyn Pilar in 2019 for management of his hypertrophic cardiomyopathy but has continued to take verapamil and metoprolol.  He was initially placed on dopamine with some improvement in his heart rate and blood pressure.  This morning around 9 AM, he became more bradycardic and hypotensive after receiving subcutaneous Lovenox.  He also complained of worsening chest pain and shortness of breath.  He remained in high-grade AV block with ventricular rates in the 30s and low 40s on dopamine 20 mcg/kg/min.  Foley catheter was then placed with resolution of complete heart block and improvement in blood pressure.  Currently, Zachary Clements continues to complain of 1/10 chest pain and shortness of breath.  He denies a history of coronary disease, with stress test in 2019 showing no evidence of ischemia.   Past Medical History:  Diagnosis Date  . Arthritis   . Dysrhythmia   . GERD (gastroesophageal reflux disease)   . Hypertension   . Hypertrophic cardiomyopathy ([place])   . Insomnia     Past Surgical History:  Procedure Laterality Date  . BACK SURGERY     rods in  back  . BACK SURGERY     removal of rods in back  . colonscopy    . KNEE ARTHROSCOPY Right   . LUMBAR LAMINECTOMY/DECOMPRESSION MICRODISCECTOMY N/A 11/09/2017   Procedure: LUMBAR LAMINECTOMY/DECOMPRESSION MICRODISCECTOMY 1 LEVEL-4-5;  Surgeon: Meade Maw, MD;  Location: [facility] ORS;  Service: Neurosurgery;  Laterality: N/A;     Home Medications:  Prior to Admission medications   Medication Sig Start Date Constance Whittle Date Taking? Authorizing Provider  atorvastatin (LIPITOR) 10 MG tablet Take 10 mg by mouth daily with supper.   Yes [provider]  DULoxetine (CYMBALTA) 60 MG capsule Take 120 mg by mouth daily.   Yes [provider]  gabapentin (NEURONTIN) 300 MG capsule Take 1 capsule by mouth in the morning, at noon, and at bedtime. 10/01/19  Yes [provider]  metoprolol succinate (TOPROL-XL) 100 MG 24 hr tablet Take 100 mg by mouth daily with supper. Take with or immediately following a meal.   Yes [provider]  Multiple Vitamin (MULTIVITAMIN WITH MINERALS) TABS tablet Take 1 tablet by mouth daily.   Yes [provider]  timolol (TIMOPTIC) 0.5 % ophthalmic solution Place 1 drop into both eyes daily.  10/17/19  Yes [provider]  traZODone (DESYREL) 50 MG tablet Take 1 tablet by mouth at bedtime. 08/05/19  Yes [provider]  verapamil (CALAN-SR) 180 MG CR tablet Take 180 mg by mouth daily.   Yes [provider]    Inpatient Medications: Scheduled Meds: . acetaminophen  1,000 mg Oral Q6H  . enoxaparin (LOVENOX) injection  40 mg Subcutaneous Q24H  .  ketorolac  30 mg Intravenous Q6H  . sodium chloride flush  3 mL Intravenous Q12H   Continuous Infusions: . sodium chloride Stopped (10/19/19 1006)  . DOPamine 15 mcg/kg/min (10/19/19 1034)   PRN Meds: atropine, magnesium hydroxide, ondansetron **OR** ondansetron (ZOFRAN) IV  Allergies:   No Known Allergies  Social History:   Social History   Tobacco Use  .  Smoking status: Former Smoker    Types: Cigarettes    Quit date: 11/04/1998    Years since quitting: 20.9  . Smokeless tobacco: Never Used  Vaping Use  . Vaping Use: Never used  Substance Use Topics  . Alcohol use: Not Currently  . Drug use: Not on file    Comment: cbd oil     Family History:   Hyperlipidemia and dementia in his mother.  Colon cancer in his father.  ROS:  Review of Systems  Unable to perform ROS: Acuity of condition    Physical Exam/Data:   Vitals:   10/19/19 0925 10/19/19 0940 10/19/19 1020 10/19/19 1025  BP: (!) 83/51 94/61 129/78 122/76  Pulse: (!) 42 (!) 37 63 85  Resp: 17 17 17 15   Temp:      TempSrc:      SpO2: 94% (!) 88% 94% 100%  Weight:      Height:        Intake/Output Summary (Last 24 hours) at 10/19/2019 1039 Last data filed at 10/19/2019 1036 Gross per 24 hour  Intake --  Output 400 ml  Net -400 ml   Last 3 Weights 10/19/2019 11/03/2017  Weight (lbs) 200 lb 201 lb  Weight (kg) 90.719 kg 91.173 kg     Body mass index is 28.7 kg/m.  General: Pale appearing man, lying on gurney.  He has a nonrebreather in place. HEENT: normal Lymph: no adenopathy Neck: Unable to assess JVP due to positioning and support devices. Endocrine:  No thryomegaly Vascular: No carotid bruits; 2+ radial pulses bilaterally. Cardiac: Regular rate and rhythm with 2/6 systolic murmur. Lungs: Coarse breath sounds bilaterally. Abd: soft with mild diffuse tenderness. Ext: no edema Musculoskeletal:  No deformities, BUE and BLE strength normal and equal Skin: warm and dry  Neuro:  CNs 2-12 intact, no focal abnormalities noted Psych:  Normal affect   EKG:  The EKG was personally reviewed and demonstrates: Sinus rhythm with trifascicular block (first-degree AV block, RBBB, and LPFB), inferior Q waves, and inferolateral ST depressions and inferior T wave inversions.  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 Telemetry:  Telemetry was  personally reviewed and demonstrates: High-grade AV block with recent switch to first-degree AV block.  Relevant CV Studies: TTE (04/14/2017): Severe LVH with LVEF greater than 55%.  Moderate mitral regurgitation.  Mild pulmonary hypertension.  Findings consistent with HOCM.  Pharmacologic MPI (04/14/2017): Normal study without ischemia or scar.  LVEF 64%.  Laboratory Data:  High Sensitivity Troponin:   Recent Labs  Lab 10/19/19 0128 10/19/19 0443  TROPONINIHS 10 15     Chemistry Recent Labs  Lab 10/19/19 0128 10/19/19 0443  NA 139 137  K 4.1 4.6  CL 104 103  CO2 28 26  GLUCOSE 116* 110*  BUN 26* 25*  CREATININE 1.21 1.03  CALCIUM 9.2 8.8*  GFRNONAA >60 >60  GFRAA >60 >60  ANIONGAP 7 8    Recent Labs  Lab 10/19/19 0128  PROT 7.0  ALBUMIN 3.9  AST 21  ALT 19  ALKPHOS 42  BILITOT 0.7   Hematology  Recent Labs  Lab 10/19/19 0128 10/19/19 0443  WBC 8.1 7.1  RBC 4.15* 3.90*  HGB 13.4 13.0  HCT 40.4 37.2*  MCV 97.3 95.4  MCH 32.3 33.3  MCHC 33.2 34.9  RDW 12.9 13.0  PLT 306 293   BNP Recent Labs  Lab 10/19/19 0128  BNP 578.6*    DDimer No results for input(s): DDIMER in the last 168 hours.   Radiology/Studies:  DG Chest 1 View  Result Date: 10/19/2019 CLINICAL DATA:  Initial evaluation for acute shortness of breath. EXAM: CHEST  1 VIEW COMPARISON:  None available. FINDINGS: Mild cardiomegaly.  Mediastinal silhouette within normal limits. Lungs mildly hypoinflated with elevation left hemidiaphragm. Mild streaky left basilar atelectasis. Perihilar vascular congestion without overt pulmonary edema. No pleural effusion. No airspace consolidation. No pneumothorax. No acute osseous finding. IMPRESSION: 1. Cardiomegaly with perihilar vascular congestion without overt pulmonary edema. 2. Elevation of the left hemidiaphragm with associated mild left basilar atelectasis. Electronically Signed   By: Jeannine Boga M.D.   On: 10/19/2019 01:39   DG Chest  Portable 1 View  Result Date: 10/19/2019 CLINICAL DATA:  Chest pain EXAM: PORTABLE CHEST 1 VIEW COMPARISON:  October 19, 2019 study obtained earlier in the day FINDINGS: There is no edema or airspace opacity. Heart is borderline prominent with mild pulmonary venous hypertension. No evident adenopathy. No pneumothorax. No bone lesions. IMPRESSION: Borderline cardiomegaly with a degree of pulmonary vascular congestion. Lungs clear. Electronically Signed   By: Lowella Grip III M.D.   On: 10/19/2019 09:22    Assessment and Plan:   Complete heart block: Patient presents with weakness and high-grade AV block, including periods of complete heart block with slow junctional escape.  Most likely, this is driven by aggressive AV nodal blockade with verapamil and metoprolol for management of hypertrophic obstructive cardiomyopathy.  However, patient also expressed some chest pain on presentation that may reflect underlying ischemia.  Heart rate has improved with high doses of dopamine as well as decompression of the bladder with a Foley catheter, suggesting that there may be some element of increased vagal tone.  I remain worried that in the short-term, Zachary Clements could return to high-grade AV block if dopamine is weaned given his persistent trifascicular block.  I have discussed further evaluation and treatment options with Dr. Nehemiah Massed, the patient, and his wife.  This includes continued close observation in the ICU and maintenance of dopamine for heart rate and blood pressure support while awaiting metoprolol and verapamil washout versus temporary transvenous pacemaker placement and coronary angiography.  The patient wishes to proceed with temporary transvenous pacemaker placement and cardiac catheterization with possible PCI.  I have reviewed the risks, indications, and alternatives to cardiac catheterization, possible angioplasty, and stenting with the patient. Risks include but are not limited to bleeding,  infection, vascular injury, stroke, myocardial infection, arrhythmia, kidney injury, radiation-related injury in the case of prolonged fluoroscopy use, emergency cardiac surgery, and death. The patient understands the risks of serious complication is 1-2 in 8295 with diagnostic cardiac cath and 1-2% or less with angioplasty/stenting.  He will need to be admitted to the ICU after the procedure for continued monitoring.  If high-grade AV block persists after washout of AV nodal blocking agents, permanent pacemaker placement will need to be considered.  Chest pain and shortness of breath: Most likely due to symptomatic bradycardia.  Some element of demand ischemia is likely also present in the setting of bradycardia and hypotension.  We have agreed to proceed with  coronary angiography, as above.  Continue supportive care in the meantime.  For questions or updates, please contact [place] Please consult [email or web address] for contact info under [facility] Cardiology.  Signed, Nelva Bush, MD  10/19/2019 10:39 AM

## 2019-10-19 NOTE — ED Notes (Signed)
Pt reports having intermittent CP, while laying still. Pt denies dizziness or weakness unless moving. NAD noted.  Wife bedside.

## 2019-10-19 NOTE — H&P (View-Only) (Signed)
Interventional Cardiology consultation:   Patient ID: Zachary Clements MRN: [id number]; DOB: 13-Sep-1955  Admit date: 10/19/2019 Date of Consult: 10/19/2019  Primary Care Provider: Derinda Late, MD [facility] Cardiologist: Miquel Dunn, MD PhD [facility] Electrophysiologist:  None    Patient Profile:   Zachary Clements is a 63 y.o. 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 Nehemiah Massed.  History of Present Illness:   Zachary Clements patient presented to the emergency department yesterday with worsening weakness and dizziness.  He was found to be bradycardic with high-grade AV block, including periods of third-degree AV block.  He was last seen by Dr. Saralyn Pilar in 2019 for management of his hypertrophic cardiomyopathy but has continued to take verapamil and metoprolol.  He was initially placed on dopamine with some improvement in his heart rate and blood pressure.  This morning around 9 AM, he became more bradycardic and hypotensive after receiving subcutaneous Lovenox.  He also complained of worsening chest pain and shortness of breath.  He remained in high-grade AV block with ventricular rates in the 30s and low 40s on dopamine 20 mcg/kg/min.  Foley catheter was then placed with resolution of complete heart block and improvement in blood pressure.  Currently, Mr. Zachary Clements continues to complain of 1/10 chest pain and shortness of breath.  He denies a history of coronary disease, with stress test in 2019 showing no evidence of ischemia.   Past Medical History:  Diagnosis Date  . Arthritis   . Dysrhythmia   . GERD (gastroesophageal reflux disease)   . Hypertension   . Hypertrophic cardiomyopathy ([place])   . Insomnia     Past Surgical History:  Procedure Laterality Date  . BACK SURGERY     rods in  back  . BACK SURGERY     removal of rods in back  . colonscopy    . KNEE ARTHROSCOPY Right   . LUMBAR LAMINECTOMY/DECOMPRESSION MICRODISCECTOMY N/A 11/09/2017   Procedure: LUMBAR LAMINECTOMY/DECOMPRESSION MICRODISCECTOMY 1 LEVEL-4-5;  Surgeon: Meade Maw, MD;  Location: [facility] ORS;  Service: Neurosurgery;  Laterality: N/A;     Home Medications:  Prior to Admission medications   Medication Sig Start Date Maree Ainley Date Taking? Authorizing Provider  atorvastatin (LIPITOR) 10 MG tablet Take 10 mg by mouth daily with supper.   Yes [provider]  DULoxetine (CYMBALTA) 60 MG capsule Take 120 mg by mouth daily.   Yes [provider]  gabapentin (NEURONTIN) 300 MG capsule Take 1 capsule by mouth in the morning, at noon, and at bedtime. 10/01/19  Yes [provider]  metoprolol succinate (TOPROL-XL) 100 MG 24 hr tablet Take 100 mg by mouth daily with supper. Take with or immediately following a meal.   Yes [provider]  Multiple Vitamin (MULTIVITAMIN WITH MINERALS) TABS tablet Take 1 tablet by mouth daily.   Yes [provider]  timolol (TIMOPTIC) 0.5 % ophthalmic solution Place 1 drop into both eyes daily.  10/17/19  Yes [provider]  traZODone (DESYREL) 50 MG tablet Take 1 tablet by mouth at bedtime. 08/05/19  Yes [provider]  verapamil (CALAN-SR) 180 MG CR tablet Take 180 mg by mouth daily.   Yes [provider]    Inpatient Medications: Scheduled Meds: . acetaminophen  1,000 mg Oral Q6H  . enoxaparin (LOVENOX) injection  40 mg Subcutaneous Q24H  .  ketorolac  30 mg Intravenous Q6H  . sodium chloride flush  3 mL Intravenous Q12H   Continuous Infusions: . sodium chloride Stopped (10/19/19 1006)  . DOPamine 15 mcg/kg/min (10/19/19 1034)   PRN Meds: atropine, magnesium hydroxide, ondansetron **OR** ondansetron (ZOFRAN) IV  Allergies:   No Known Allergies  Social History:   Social History   Tobacco Use  .  Smoking status: Former Smoker    Types: Cigarettes    Quit date: 11/04/1998    Years since quitting: 20.9  . Smokeless tobacco: Never Used  Vaping Use  . Vaping Use: Never used  Substance Use Topics  . Alcohol use: Not Currently  . Drug use: Not on file    Comment: cbd oil     Family History:   Hyperlipidemia and dementia in his mother.  Colon cancer in his father.  ROS:  Review of Systems  Unable to perform ROS: Acuity of condition    Physical Exam/Data:   Vitals:   10/19/19 0925 10/19/19 0940 10/19/19 1020 10/19/19 1025  BP: (!) 83/51 94/61 129/78 122/76  Pulse: (!) 42 (!) 37 63 85  Resp: 17 17 17 15   Temp:      TempSrc:      SpO2: 94% (!) 88% 94% 100%  Weight:      Height:        Intake/Output Summary (Last 24 hours) at 10/19/2019 1039 Last data filed at 10/19/2019 1036 Gross per 24 hour  Intake --  Output 400 ml  Net -400 ml   Last 3 Weights 10/19/2019 11/03/2017  Weight (lbs) 200 lb 201 lb  Weight (kg) 90.719 kg 91.173 kg     Body mass index is 28.7 kg/m.  General: Pale appearing man, lying on gurney.  He has a nonrebreather in place. HEENT: normal Lymph: no adenopathy Neck: Unable to assess JVP due to positioning and support devices. Endocrine:  No thryomegaly Vascular: No carotid bruits; 2+ radial pulses bilaterally. Cardiac: Regular rate and rhythm with 2/6 systolic murmur. Lungs: Coarse breath sounds bilaterally. Abd: soft with mild diffuse tenderness. Ext: no edema Musculoskeletal:  No deformities, BUE and BLE strength normal and equal Skin: warm and dry  Neuro:  CNs 2-12 intact, no focal abnormalities noted Psych:  Normal affect   EKG:  The EKG was personally reviewed and demonstrates: Sinus rhythm with trifascicular block (first-degree AV block, RBBB, and LPFB), inferior Q waves, and inferolateral ST depressions and inferior T wave inversions.  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 Telemetry:  Telemetry was  personally reviewed and demonstrates: High-grade AV block with recent switch to first-degree AV block.  Relevant CV Studies: TTE (04/14/2017): Severe LVH with LVEF greater than 55%.  Moderate mitral regurgitation.  Mild pulmonary hypertension.  Findings consistent with HOCM.  Pharmacologic MPI (04/14/2017): Normal study without ischemia or scar.  LVEF 64%.  Laboratory Data:  High Sensitivity Troponin:   Recent Labs  Lab 10/19/19 0128 10/19/19 0443  TROPONINIHS 10 15     Chemistry Recent Labs  Lab 10/19/19 0128 10/19/19 0443  NA 139 137  K 4.1 4.6  CL 104 103  CO2 28 26  GLUCOSE 116* 110*  BUN 26* 25*  CREATININE 1.21 1.03  CALCIUM 9.2 8.8*  GFRNONAA >60 >60  GFRAA >60 >60  ANIONGAP 7 8    Recent Labs  Lab 10/19/19 0128  PROT 7.0  ALBUMIN 3.9  AST 21  ALT 19  ALKPHOS 42  BILITOT 0.7   Hematology  Recent Labs  Lab 10/19/19 0128 10/19/19 0443  WBC 8.1 7.1  RBC 4.15* 3.90*  HGB 13.4 13.0  HCT 40.4 37.2*  MCV 97.3 95.4  MCH 32.3 33.3  MCHC 33.2 34.9  RDW 12.9 13.0  PLT 306 293   BNP Recent Labs  Lab 10/19/19 0128  BNP 578.6*    DDimer No results for input(s): DDIMER in the last 168 hours.   Radiology/Studies:  DG Chest 1 View  Result Date: 10/19/2019 CLINICAL DATA:  Initial evaluation for acute shortness of breath. EXAM: CHEST  1 VIEW COMPARISON:  None available. FINDINGS: Mild cardiomegaly.  Mediastinal silhouette within normal limits. Lungs mildly hypoinflated with elevation left hemidiaphragm. Mild streaky left basilar atelectasis. Perihilar vascular congestion without overt pulmonary edema. No pleural effusion. No airspace consolidation. No pneumothorax. No acute osseous finding. IMPRESSION: 1. Cardiomegaly with perihilar vascular congestion without overt pulmonary edema. 2. Elevation of the left hemidiaphragm with associated mild left basilar atelectasis. Electronically Signed   By: Jeannine Boga M.D.   On: 10/19/2019 01:39   DG Chest  Portable 1 View  Result Date: 10/19/2019 CLINICAL DATA:  Chest pain EXAM: PORTABLE CHEST 1 VIEW COMPARISON:  October 19, 2019 study obtained earlier in the day FINDINGS: There is no edema or airspace opacity. Heart is borderline prominent with mild pulmonary venous hypertension. No evident adenopathy. No pneumothorax. No bone lesions. IMPRESSION: Borderline cardiomegaly with a degree of pulmonary vascular congestion. Lungs clear. Electronically Signed   By: Lowella Grip III M.D.   On: 10/19/2019 09:22    Assessment and Plan:   Complete heart block: Patient presents with weakness and high-grade AV block, including periods of complete heart block with slow junctional escape.  Most likely, this is driven by aggressive AV nodal blockade with verapamil and metoprolol for management of hypertrophic obstructive cardiomyopathy.  However, patient also expressed some chest pain on presentation that may reflect underlying ischemia.  Heart rate has improved with high doses of dopamine as well as decompression of the bladder with a Foley catheter, suggesting that there may be some element of increased vagal tone.  I remain worried that in the short-term, Mr. Schlitt could return to high-grade AV block if dopamine is weaned given his persistent trifascicular block.  I have discussed further evaluation and treatment options with Dr. Nehemiah Massed, the patient, and his wife.  This includes continued close observation in the ICU and maintenance of dopamine for heart rate and blood pressure support while awaiting metoprolol and verapamil washout versus temporary transvenous pacemaker placement and coronary angiography.  The patient wishes to proceed with temporary transvenous pacemaker placement and cardiac catheterization with possible PCI.  I have reviewed the risks, indications, and alternatives to cardiac catheterization, possible angioplasty, and stenting with the patient. Risks include but are not limited to bleeding,  infection, vascular injury, stroke, myocardial infection, arrhythmia, kidney injury, radiation-related injury in the case of prolonged fluoroscopy use, emergency cardiac surgery, and death. The patient understands the risks of serious complication is 1-2 in 4315 with diagnostic cardiac cath and 1-2% or less with angioplasty/stenting.  He will need to be admitted to the ICU after the procedure for continued monitoring.  If high-grade AV block persists after washout of AV nodal blocking agents, permanent pacemaker placement will need to be considered.  Chest pain and shortness of breath: Most likely due to symptomatic bradycardia.  Some element of demand ischemia is likely also present in the setting of bradycardia and hypotension.  We have agreed to proceed with  coronary angiography, as above.  Continue supportive care in the meantime.  For questions or updates, please contact [place] Please consult [email or web address] for contact info under [facility] Cardiology.  Signed, Nelva Bush, MD  10/19/2019 10:39 AM

## 2019-10-19 NOTE — ED Notes (Signed)
Called and spoke with pts wife and updated on pt status

## 2019-10-19 NOTE — ED Provider Notes (Signed)
Called to see patient who became suddenly more bradycardic with a heart rate down to 29 blood pressure dropped into the 60s.  Patient put in Trendelenburg and given half milligram of atropine and a second half milligram of atropine with no result.  Fluid opened up and dopamine put up from 7-14 mics.  Still no result EKG done showed ST elevation in aVL and ST segment depression in V45 and 6 and also in aVF.  Patient returned on and started go up to 24 mA patient's heart rate goes up but captures not achieved.  Patient's blood pressure goes up as well patient is very uncomfortable so patient was turned off.  Patient is pressure and heart rate then trended downward into the 80s.  Dopamine put up to 20 mics.  Patient has been having chest pain this whole time.  He says his chest pressure with some shortness of breath.  He is tender to palpation in the epigastric area and right upper quadrant.  Bedside ultrasound attempted to look at the gallbladder.  There is some air in the way possibly in the colon.  It is difficult to see around this but from what I can tell there do not appear to be any stones in the gallbladder.  Chest x-ray shows some increased markings.  Call Dr. Saunders Revel who is on-call for STEMI and showed an EKG.  It is not a STEMI but it is worrisome.  Dr. And thought there was enough time to try and get a hold of Dr. Nehemiah Massed who is his treating physician.  Dr. Nehemiah Massed was gotten he came by to see the patient.  He looked at the EKGs.  A second EKG had been done that showed improvement of the changes 30 EKG was done then and the changes were still better.  Patient was given a GI cocktail and sat up and felt some better.  Blood pressures in the 80s heart rates in the 30s now.  Dopamine is at 20 mics and is gotten about 200 cc of fluid at this point.  Dr. Nehemiah Massed is going to go check on the possibility putting a pacer wire in this gentleman unfortunately right the second there is a another STEMI there will need to  Cath Lab first.  Patient seems to be improved at this point EKG changes that had turned out have improved as well.   Nena Polio, MD 10/19/19 [phone]

## 2019-10-19 NOTE — ED Provider Notes (Signed)
Posterior EKG looks okay.  BiPAP machine comes in room along with patient's wife.  Patient's O2 sats have almost immediately gone up into the 90s without putting him on the BiPAP and his blood pressures in the 224 systolic.  Dr. Nehemiah Massed is going to take him to the Cath Lab.  I will leave him off the BiPAP for now but keep the BiPAP immediately available just in case.   Nena Polio, MD 10/19/19 1022

## 2019-10-19 NOTE — Progress Notes (Addendum)
Admitted to ICU # 8 from Cath Lab post Left heart catherization with temporary pacemaker in right IJ. Right TR band in place.

## 2019-10-19 NOTE — Consult Note (Signed)
[facility] Cardiology Consultation Note  Patient ID: Zachary Clements, MRN: [id number], DOB/AGE: 1956/02/12 64 y.o. Admit date: 10/19/2019   Date of Consult: 10/19/2019 Primary Physician: Derinda Late, MD Primary Cardiologist: Paraschos  Chief Complaint:  Chief Complaint  Patient presents with  . Chest Pain  . Dizziness   Reason for Consult: Weakness  HPI: 64 y.o. male with known hypertension hyperlipidemia and hypertrophic obstructive cardiomyopathy on appropriate medication management.  The patient has had hypertrophic cardiomyopathy in the past which is medically managed with appropriate medications and has had no evidence of issues until recently.  He has had a stress test and echocardiogram within the last year showing no evidence of myocardial ischemia and/or significant gradient of his hypertrophic cardiomyopathy.  With this the patient recently had some weakness dizziness and unable to do any physical activity with shortness of breath.  When seen in the emergency room the patient has had an EKG showing sinus bradycardia with junctional escape at 30 to 40 bpm.  This was likely secondary to medication management.  In addition to that the patient was placed on dopamine for which she has had better blood pressure as well as improvements of heart rate with a one-to-one conduction heart rate at 68 bpm.  There is no evidence of congestive heart failure angina or myocardial infarction at this time.  His BNP is slightly elevated at 570  Past Medical History:  Diagnosis Date  . Arthritis   . Dysrhythmia   . GERD (gastroesophageal reflux disease)   . Hypertension   . Hypertrophic cardiomyopathy ([place])   . Insomnia       Surgical History:  Past Surgical History:  Procedure Laterality Date  . BACK SURGERY     rods in back  . BACK SURGERY     removal of rods in back  . colonscopy    . KNEE ARTHROSCOPY Right   . LUMBAR LAMINECTOMY/DECOMPRESSION MICRODISCECTOMY N/A 11/09/2017    Procedure: LUMBAR LAMINECTOMY/DECOMPRESSION MICRODISCECTOMY 1 LEVEL-4-5;  Surgeon: Meade Maw, MD;  Location: [facility] ORS;  Service: Neurosurgery;  Laterality: N/A;     Home Meds: Prior to Admission medications   Medication Sig Start Date End Date Taking? Authorizing Provider  atorvastatin (LIPITOR) 10 MG tablet Take 10 mg by mouth daily with supper.   Yes [provider]  DULoxetine (CYMBALTA) 60 MG capsule Take 120 mg by mouth daily.   Yes [provider]  gabapentin (NEURONTIN) 300 MG capsule Take 1 capsule by mouth in the morning, at noon, and at bedtime. 10/01/19  Yes [provider]  metoprolol succinate (TOPROL-XL) 100 MG 24 hr tablet Take 100 mg by mouth daily with supper. Take with or immediately following a meal.   Yes [provider]  Multiple Vitamin (MULTIVITAMIN WITH MINERALS) TABS tablet Take 1 tablet by mouth daily.   Yes [provider]  timolol (TIMOPTIC) 0.5 % ophthalmic solution Place 1 drop into both eyes daily.  10/17/19  Yes [provider]  traZODone (DESYREL) 50 MG tablet Take 1 tablet by mouth at bedtime. 08/05/19  Yes [provider]  verapamil (CALAN-SR) 180 MG CR tablet Take 180 mg by mouth daily.   Yes [provider]    Inpatient Medications:  . acetaminophen  1,000 mg Oral Q6H  . enoxaparin (LOVENOX) injection  40 mg Subcutaneous Q24H  . ketorolac  30 mg Intravenous Q6H   . sodium chloride 100 mL/hr at 10/19/19 0754  . DOPamine 7 mcg/kg/min (10/19/19 0753)    Allergies:  No Known Allergies  Social History   Socioeconomic History  . Marital status: Married    Spouse name: Not on file  . Number of children: Not on file  . Years of education: Not on file  . Highest education level: Not on file  Occupational History  . Not on file  Tobacco Use  . Smoking status: Former Smoker    Types: Cigarettes    Quit date: 11/04/1998    Years since quitting: 20.9  . Smokeless tobacco: Never  Used  Vaping Use  . Vaping Use: Never used  Substance and Sexual Activity  . Alcohol use: Not Currently  . Drug use: Not on file    Comment: cbd oil  . Sexual activity: Not on file  Other Topics Concern  . Not on file  Social History Narrative  . Not on file   Social Determinants of Health   Financial Resource Strain:   . Difficulty of Paying Living Expenses: Not on file  Food Insecurity:   . Worried About Charity fundraiser in the Last Year: Not on file  . Ran Out of Food in the Last Year: Not on file  Transportation Needs:   . Lack of Transportation (Medical): Not on file  . Lack of Transportation (Non-Medical): Not on file  Physical Activity:   . Days of Exercise per Week: Not on file  . Minutes of Exercise per Session: Not on file  Stress:   . Feeling of Stress : Not on file  Social Connections:   . Frequency of Communication with Friends and Family: Not on file  . Frequency of Social Gatherings with Friends and Family: Not on file  . Attends Religious Services: Not on file  . Active Member of Clubs or Organizations: Not on file  . Attends Archivist Meetings: Not on file  . Marital Status: Not on file  Intimate Partner Violence:   . Fear of Current or Ex-Partner: Not on file  . Emotionally Abused: Not on file  . Physically Abused: Not on file  . Sexually Abused: Not on file     History reviewed. No pertinent family history.   Review of Systems Positive for shortness of breath weakness Negative for: General:  chills, fever, night sweats or weight changes.  Cardiovascular: PND orthopnea syncope dizziness  Dermatological skin lesions rashes Respiratory: Cough congestion Urologic: Frequent urination urination at night and hematuria Abdominal: negative for nausea, vomiting, diarrhea, bright red blood per rectum, melena, or hematemesis Neurologic: negative for visual changes, and/or hearing changes  All other systems reviewed and are otherwise negative  except as noted above.  Labs: No results for input(s): CKTOTAL, CKMB, TROPONINI in the last 72 hours. Lab Results  Component Value Date   WBC 7.1 10/19/2019   HGB 13.0 10/19/2019   HCT 37.2 (L) 10/19/2019   MCV 95.4 10/19/2019   PLT 293 10/19/2019    Recent Labs  Lab 10/19/19 0128 10/19/19 0128 10/19/19 0443  NA 139   < > 137  K 4.1   < > 4.6  CL 104   < > 103  CO2 28   < > 26  BUN 26*   < > 25*  CREATININE 1.21   < > 1.03  CALCIUM 9.2   < > 8.8*  PROT 7.0  --   --   BILITOT 0.7  --   --   ALKPHOS 42  --   --   ALT 19  --   --  AST 21  --   --   GLUCOSE 116*   < > 110*   < > = values in this interval not displayed.   No results found for: CHOL, HDL, LDLCALC, TRIG No results found for: DDIMER  Radiology/Studies:  DG Chest 1 View  Result Date: 10/19/2019 CLINICAL DATA:  Initial evaluation for acute shortness of breath. EXAM: CHEST  1 VIEW COMPARISON:  None available. FINDINGS: Mild cardiomegaly.  Mediastinal silhouette within normal limits. Lungs mildly hypoinflated with elevation left hemidiaphragm. Mild streaky left basilar atelectasis. Perihilar vascular congestion without overt pulmonary edema. No pleural effusion. No airspace consolidation. No pneumothorax. No acute osseous finding. IMPRESSION: 1. Cardiomegaly with perihilar vascular congestion without overt pulmonary edema. 2. Elevation of the left hemidiaphragm with associated mild left basilar atelectasis. Electronically Signed   By: Jeannine Boga M.D.   On: 10/19/2019 01:39    EKG: Sinus bradycardia with junctional escape  Weights: Filed Weights   10/19/19 0115  Weight: 90.7 kg     Physical Exam: Blood pressure (!) 85/59, pulse 64, temperature 97.6 F (36.4 C), temperature source Oral, resp. rate 12, height 5\' 10"  (1.778 m), weight 90.7 kg, SpO2 91 %. Body mass index is 28.7 kg/m. General: Well developed, well nourished, in no acute distress. Head eyes ears nose throat: Normocephalic, atraumatic,  sclera non-icteric, no xanthomas, nares are without discharge. No apparent thyromegaly and/or mass  Lungs: Normal respiratory effort.  no wheezes, no rales, no rhonchi.  Heart: Slow rate with normal S1 S2.  3-4+ left sternal border murmur gallop, no rub, PMI is normal size and placement, carotid upstroke normal without bruit, jugular venous pressure is normal Abdomen: Soft, non-tender, non-distended with normoactive bowel sounds. No hepatomegaly. No rebound/guarding. No obvious abdominal masses. Abdominal aorta is normal size without bruit Extremities: No edema. no cyanosis, no clubbing, no ulcers  Peripheral : 2+ bilateral upper extremity pulses, 2+ bilateral femoral pulses, 2+ bilateral dorsal pedal pulse Neuro: Alert and oriented. No facial asymmetry. No focal deficit. Moves all extremities spontaneously. Musculoskeletal: Normal muscle tone without kyphosis Psych:  Responds to questions appropriately with a normal affect.    Assessment: 64 year old male with hypertension hyperlipidemia and hypertrophic obstructive cardiomyopathy having significant symptoms from severe bradycardia and heart block likely due to medication management without evidence of significant myocardial infarction or true angina  Plan: 1.  Abstain from medication management including beta-blocker calcium channel blocker due to hypotension and severe bradycardia with heart block 2.  Continue dopamine throughout today for better heart rate control and increasing heart rate with one-to-one conduction with better blood pressure elevation as well 3.  Consideration of echocardiogram for further evaluation and treatment options of extent of hypertrophic obstructive cardiomyopathy 4.  No further intervention or pacemaker at this time until full washout of medication throughout 5 half-lives 5.  Further treatment options after above  Signed, Corey Skains M.D. [facility] 10/19/2019, 8:45 AM

## 2019-10-19 NOTE — ED Notes (Signed)
Pt c/o feeling worse and very anxious. Dr. Cinda Quest and Dr. Nehemiah Massed informed. Dr. Cinda Quest at bedside

## 2019-10-19 NOTE — ED Notes (Signed)
Pt's HR remains 32-34 1 hour ago, BP 102/74. Admitting MD messaged for further orders.

## 2019-10-19 NOTE — ED Notes (Signed)
Pts SpO2 88-89% on 4 liters via [place]. Pt increased to 5 liters via [place]

## 2019-10-19 NOTE — ED Notes (Signed)
This RN in room. Pt heart rate dropped to 33. Pt is c/o chest pain, shortness of breath, and dizziness. RN called charge RN to get EDP to bedside.

## 2019-10-19 NOTE — ED Notes (Signed)
Pt had acute change in heart rate. Pt bradycardic between 29-30's. Pt c/o chest pressure 5/10, shortness of breath, and lightheadedness.

## 2019-10-19 NOTE — Progress Notes (Signed)
Good afternoon. ECG rythum flips between SB and Idioventricular with very little pacing. Patient alert and calm. Complains of mild pain in R IJ where temporary pacer is inserted.

## 2019-10-19 NOTE — ED Notes (Signed)
Cardiology at bedside.

## 2019-10-19 NOTE — Progress Notes (Signed)
PROGRESS NOTE    Zachary Clements  MAU:[id number] DOB: 12-Nov-1955 DOA: 10/19/2019 PCP: Derinda Late, MD    Assessment & Plan:   Active Problems:   Complete heart block (HCC)   CHB (complete heart block) (HCC)   Symptomatic bradycardia: w/ possible third-degree AV block. Continue to hold metoprolol, verapamil. IV atropine prn. Continue on tele. S/p temporary transvenous pacemaker placement 10/19/19 as per cardio. Weaning off of dopamine and starting on IV phenylephrine as per cardio. Cardio recs apprec   Chest pain: etiology unclear, likely secondary to symptomatic bradycardia. Troponins neg x 2.  HLD: will continue on statin   Peripheral neuropathy: will continue on home dose of gabapentin   Glaucoma: will hold home dose of timolol eye drops secondary to symptomatic bradycardia     DVT prophylaxis: SCDs Code Status: full  Family Communication: discussed pt's care w/ pt's wife who is at bedside and answered her questions  Disposition Plan: depends on PT/OT recs    Consultants:      Procedures:    Antimicrobials:    Subjective: Pt c/o chest pain   Objective: Vitals:   10/19/19 0700 10/19/19 0715 10/19/19 0730 10/19/19 0745  BP: 94/60 (!) 96/57 (!) 96/56 (!) 95/58  Pulse: 70 72 69 66  Resp: 13 13 14 16   Temp:      TempSrc:      SpO2: 94% 92% 100% 94%  Weight:      Height:       No intake or output data in the 24 hours ending 10/19/19 0755 Filed Weights   10/19/19 0115  Weight: 90.7 kg    Examination:  General exam: Appears calm and comfortable  Respiratory system: Clear to auscultation. Respiratory effort normal. Cardiovascular system: bradycardia. No rubs, gallops or clicks.  Gastrointestinal system: Abdomen is nondistended, soft and nontender.  Normal bowel sounds heard. Central nervous system: Alert and oriented. Moves all 4 extremities  Psychiatry: Judgement and insight appear normal. Mood & affect appropriate.     Data Reviewed: I have  personally reviewed following labs and imaging studies  CBC: Recent Labs  Lab 10/19/19 0128 10/19/19 0443  WBC 8.1 7.1  NEUTROABS 4.6  --   HGB 13.4 13.0  HCT 40.4 37.2*  MCV 97.3 95.4  PLT 306 563   Basic Metabolic Panel: Recent Labs  Lab 10/19/19 0120 10/19/19 0128 10/19/19 0443  NA  --  139 137  K  --  4.1 4.6  CL  --  104 103  CO2  --  28 26  GLUCOSE  --  116* 110*  BUN  --  26* 25*  CREATININE  --  1.21 1.03  CALCIUM  --  9.2 8.8*  MG 2.2  --   --    GFR: Estimated Creatinine Clearance: 79.9 mL/min (by C-G formula based on SCr of 1.03 mg/dL). Liver Function Tests: Recent Labs  Lab 10/19/19 0128  AST 21  ALT 19  ALKPHOS 42  BILITOT 0.7  PROT 7.0  ALBUMIN 3.9   No results for input(s): LIPASE, AMYLASE in the last 168 hours. No results for input(s): AMMONIA in the last 168 hours. Coagulation Profile: No results for input(s): INR, PROTIME in the last 168 hours. Cardiac Enzymes: No results for input(s): CKTOTAL, CKMB, CKMBINDEX, TROPONINI in the last 168 hours. BNP (last 3 results) No results for input(s): PROBNP in the last 8760 hours. HbA1C: No results for input(s): HGBA1C in the last 72 hours. CBG: No results for input(s): GLUCAP in the last  168 hours. Lipid Profile: No results for input(s): CHOL, HDL, LDLCALC, TRIG, CHOLHDL, LDLDIRECT in the last 72 hours. Thyroid Function Tests: No results for input(s): TSH, T4TOTAL, FREET4, T3FREE, THYROIDAB in the last 72 hours. Anemia Panel: No results for input(s): VITAMINB12, FOLATE, FERRITIN, TIBC, IRON, RETICCTPCT in the last 72 hours. Sepsis Labs: No results for input(s): PROCALCITON, LATICACIDVEN in the last 168 hours.  Recent Results (from the past 240 hour(s))  SARS Coronavirus 2 by RT PCR (hospital order, performed in [facility] hospital lab) Nasopharyngeal Nasopharyngeal Swab     Status: None   Collection Time: 10/19/19  1:53 AM   Specimen: Nasopharyngeal Swab  Result Value Ref Range Status    SARS Coronavirus 2 NEGATIVE NEGATIVE Final    Comment: (NOTE) SARS-CoV-2 target nucleic acids are NOT DETECTED.  The SARS-CoV-2 RNA is generally detectable in upper and lower respiratory specimens during the acute phase of infection. The lowest concentration of SARS-CoV-2 viral copies this assay can detect is 250 copies / mL. A negative result does not preclude SARS-CoV-2 infection and should not be used as the sole basis for treatment or other patient management decisions.  A negative result may occur with improper specimen collection / handling, submission of specimen other than nasopharyngeal swab, presence of viral mutation(s) within the areas targeted by this assay, and inadequate number of viral copies (<250 copies / mL). A negative result must be combined with clinical observations, patient history, and epidemiological information.  Fact Sheet for Patients:   StrictlyIdeas.no  Fact Sheet for Healthcare Providers: BankingDealers.co.za  This test is not yet approved or  cleared by the [place] FDA and has been authorized for detection and/or diagnosis of SARS-CoV-2 by FDA under an Emergency Use Authorization (EUA).  This EUA will remain in effect (meaning this test can be used) for the duration of the COVID-19 declaration under Section 564(b)(1) of the Act, 21 U.S.C. section 360bbb-3(b)(1), unless the authorization is terminated or revoked sooner.  Performed at [facility], [address], [place], [place] [address]          Radiology Studies: DG Chest 1 View  Result Date: 10/19/2019 CLINICAL DATA:  Initial evaluation for acute shortness of breath. EXAM: CHEST  1 VIEW COMPARISON:  None available. FINDINGS: Mild cardiomegaly.  Mediastinal silhouette within normal limits. Lungs mildly hypoinflated with elevation left hemidiaphragm. Mild streaky left basilar atelectasis. Perihilar vascular congestion without  overt pulmonary edema. No pleural effusion. No airspace consolidation. No pneumothorax. No acute osseous finding. IMPRESSION: 1. Cardiomegaly with perihilar vascular congestion without overt pulmonary edema. 2. Elevation of the left hemidiaphragm with associated mild left basilar atelectasis. Electronically Signed   By: Jeannine Boga M.D.   On: 10/19/2019 01:39        Scheduled Meds: . acetaminophen  1,000 mg Oral Q6H  . enoxaparin (LOVENOX) injection  40 mg Subcutaneous Q24H  . ketorolac  30 mg Intravenous Q6H   Continuous Infusions: . sodium chloride 100 mL/hr at 10/19/19 0754  . DOPamine 7 mcg/kg/min (10/19/19 0753)     LOS: 0 days    Time spent: 30 mins    Wyvonnia Dusky, MD [facility] Pager [phone]  If 7PM-7AM, please contact night-coverage [email or web address] 10/19/2019, 7:55 AM

## 2019-10-19 NOTE — Progress Notes (Addendum)
Cross Cover Note Patient without improvement in heart rate with intermittent atropine doses. Dopamine ordered and admit status changed to stepdown   Secondary to hypotensive effects of gabapentin, it has been held.  He has been started on around the clock acetaminophen and torodol.  Can utilize narc for severe pain once heart rate and bp improves

## 2019-10-19 NOTE — ED Triage Notes (Signed)
PT to ED via POV from home c/o getting hot, dizzy, and SHOB when standing as well as CP while standing. PT states fine while sitting, sx started earlier today. NO cough or fever. HX hypertrophic cardiomyopathy. Pt color WDL, skin dry.

## 2019-10-19 NOTE — ED Notes (Signed)
Admitting NP bedside.

## 2019-10-19 NOTE — ED Provider Notes (Signed)
Nurse is able to get Foley in.  Patient reports he is feeling much better upper abdominal pain is gone away chest pain feels better.  His blood pressure and heart rate have gone back towards normal.  Dr. Nehemiah Massed and Dr. Saunders Revel are here though and they are going to take him to the Cath Lab to investigate these EKG changes he has been having although they have resolved currently.  They got consent from the patient and his wife.  Patient does want to have the test done.  We are going down on the dopamine.   Nena Polio, MD 10/19/19 1036

## 2019-10-19 NOTE — ED Notes (Signed)
Pt placed on non-rebreather at this time  

## 2019-10-19 NOTE — ED Provider Notes (Signed)
[facility] Emergency Department Provider Note  ____________________________________________   First MD Initiated Contact with Patient 10/19/19 0130     (approximate)  I have reviewed the triage vital signs and the nursing notes.   HISTORY  Chief Complaint Chest Pain and Dizziness    HPI Zachary Clements is a 64 y.o. male whose medical history most notably includes hypertrophic cardiomyopathy for which he is followed by Dr. Saralyn Pilar.  He presents tonight for acute onset dizziness/lightheadedness with some chest pain and exertional dyspnea.  This occurred in the late afternoon while he was not doing anything in particular.  His he continued to feel unwell throughout the course of the day and had at least one additional episode of feeling notably worse.  He currently feels okay as long as he is resting but has some mild ongoing chest pain that is dull and aching in the center of his chest.  He is not short of breath unless he exerts himself.    He is fully vaccinated for COVID-19 and has no fever, sore throat, nausea, vomiting, abdominal pain, or dysuria.  He had an abnormal EKG and a heart rate of 40 upon triage and was brought immediately back to an exam room.         Past Medical History:  Diagnosis Date  . Arthritis   . Dysrhythmia   . GERD (gastroesophageal reflux disease)   . Hypertension   . Hypertrophic cardiomyopathy ([place])   . Insomnia     Patient Active Problem List   Diagnosis Date Noted  . Complete heart block ([place]) 10/19/2019    Past Surgical History:  Procedure Laterality Date  . BACK SURGERY     rods in back  . BACK SURGERY     removal of rods in back  . colonscopy    . KNEE ARTHROSCOPY Right   . LUMBAR LAMINECTOMY/DECOMPRESSION MICRODISCECTOMY N/A 11/09/2017   Procedure: LUMBAR LAMINECTOMY/DECOMPRESSION MICRODISCECTOMY 1 LEVEL-4-5;  Surgeon: Meade Maw, MD;  Location: [facility] ORS;  Service: Neurosurgery;  Laterality: N/A;     Prior to Admission medications   Medication Sig Start Date End Date Taking? Authorizing Provider  aspirin-acetaminophen-caffeine (EXCEDRIN MIGRAINE) [phone] MG tablet Take 2 tablets by mouth 2 (two) times daily as needed for headache.    [provider]  atorvastatin (LIPITOR) 10 MG tablet Take 10 mg by mouth daily with supper.    [provider]  DULoxetine (CYMBALTA) 60 MG capsule Take 120 mg by mouth daily.    [provider]  latanoprost (XALATAN) 0.005 % ophthalmic solution Place 1 drop into both eyes at bedtime.    [provider]  methocarbamol (ROBAXIN) 500 MG tablet Take 1 tablet (500 mg total) by mouth every 6 (six) hours as needed for muscle spasms. 11/09/17   Marin Olp, PA-C  metoprolol succinate (TOPROL-XL) 100 MG 24 hr tablet Take 100 mg by mouth daily with supper. Take with or immediately following a meal.    [provider]  Multiple Vitamin (MULTIVITAMIN WITH MINERALS) TABS tablet Take 1 tablet by mouth daily.    [provider]  oxyCODONE (ROXICODONE) 5 MG immediate release tablet Take 1 tablet (5 mg total) by mouth every 4 (four) hours as needed for breakthrough pain. 11/09/17   Marin Olp, PA-C  timolol (TIMOPTIC) 0.25 % ophthalmic solution Place 1 drop into both eyes daily.    [provider]  verapamil (CALAN-SR) 180 MG CR tablet Take 180 mg by mouth daily.  [provider]    Allergies Patient has no known allergies.  History reviewed. No pertinent family history.  Social History Social History   Tobacco Use  . Smoking status: Former Smoker    Types: Cigarettes    Quit date: 11/04/1998    Years since quitting: 20.9  . Smokeless tobacco: Never Used  Vaping Use  . Vaping Use: Never used  Substance Use Topics  . Alcohol use: Not Currently  . Drug use: Not on file    Comment: cbd oil    Review of Systems Constitutional: No fever/chills Eyes: No visual changes. ENT: No sore  throat. Cardiovascular: +chest pain. Respiratory: Shortness of breath with exertion. Gastrointestinal: No abdominal pain.  No nausea, no vomiting.  No diarrhea.  No constipation. Genitourinary: Negative for dysuria. Musculoskeletal: Negative for neck pain.  Negative for back pain. Integumentary: Negative for rash. Neurological: Dizziness and lightheadedness, particularly with change of position.  Negative for headaches, focal weakness or numbness.   ____________________________________________   PHYSICAL EXAM:  VITAL SIGNS: ED Triage Vitals  Enc Vitals Group     BP 10/19/19 0119 135/74     Pulse Rate 10/19/19 0119 (!) 40     Resp 10/19/19 0119 20     Temp 10/19/19 0119 97.6 F (36.4 C)     Temp Source 10/19/19 0119 Oral     SpO2 10/19/19 0119 95 %     Weight 10/19/19 0115 90.7 kg (200 lb)     Height 10/19/19 0115 1.778 m (5\' 10" )     Head Circumference --      Peak Flow --      Pain Score 10/19/19 0115 6     Pain Loc --      Pain Edu? --      Excl. in [place]? --     Constitutional: Alert and oriented.  Generally well-appearing. Eyes: Conjunctivae are normal.  Head: Atraumatic. Nose: No congestion/rhinnorhea. Mouth/Throat: Patient is wearing a mask. Neck: No stridor.  No meningeal signs.   Cardiovascular: Bradycardia, regular rhythm.  Normal capillary refill. Respiratory: Normal respiratory effort.  No retractions. Gastrointestinal: Soft and nontender. No distention.  Musculoskeletal: No lower extremity tenderness nor edema. No gross deformities of extremities. Neurologic:  Normal speech and language. No gross focal neurologic deficits are appreciated.  Skin:  Skin is warm, dry and intact. Psychiatric: Mood and affect are normal. Speech and behavior are normal.  ____________________________________________   LABS (all labs ordered are listed, but only abnormal results are displayed)  Labs Reviewed  CBC WITH DIFFERENTIAL/PLATELET - Abnormal; Notable for the following  components:      Result Value   RBC 4.15 (*)    All other components within normal limits  COMPREHENSIVE METABOLIC PANEL - Abnormal; Notable for the following components:   Glucose, Bld 116 (*)    BUN 26 (*)    All other components within normal limits  BRAIN NATRIURETIC PEPTIDE - Abnormal; Notable for the following components:   B Natriuretic Peptide 578.6 (*)    All other components within normal limits  SARS CORONAVIRUS 2 BY RT PCR (HOSPITAL ORDER, [place] [facility])  MAGNESIUM  HIV ANTIBODY (ROUTINE TESTING W REFLEX)  BASIC METABOLIC PANEL  CBC  TROPONIN I (HIGH SENSITIVITY)  TROPONIN I (HIGH SENSITIVITY)   ____________________________________________  EKG  ED ECG REPORT I, Hinda Kehr, the attending physician, personally viewed and interpreted this ECG.  Date: 10/19/2019 EKG Time: 1:11 AM Rate: 41 Rhythm: Severe bradycardia, third-degree AV block  versus AV disassociation QRS Axis: normal Intervals: Prolonged QRS duration ST/T Wave abnormalities: Non-specific ST segment / T-wave changes, but no clear evidence of acute ischemia. Narrative Interpretation: no definitive evidence of acute ischemia; does not meet STEMI criteria.   ____________________________________________  RADIOLOGY I, Hinda Kehr, personally viewed and evaluated these images (plain radiographs) as part of my medical decision making, as well as reviewing the written report by the radiologist.  ED MD interpretation: Cardiomegaly at baseline with some perihilar vascular congestion but no overt pulmonary edema.  Elevation of left hemidiaphragm.  Official radiology report(s): DG Chest 1 View  Result Date: 10/19/2019 CLINICAL DATA:  Initial evaluation for acute shortness of breath. EXAM: CHEST  1 VIEW COMPARISON:  None available. FINDINGS: Mild cardiomegaly.  Mediastinal silhouette within normal limits. Lungs mildly hypoinflated with elevation left hemidiaphragm. Mild streaky left  basilar atelectasis. Perihilar vascular congestion without overt pulmonary edema. No pleural effusion. No airspace consolidation. No pneumothorax. No acute osseous finding. IMPRESSION: 1. Cardiomegaly with perihilar vascular congestion without overt pulmonary edema. 2. Elevation of the left hemidiaphragm with associated mild left basilar atelectasis. Electronically Signed   By: Jeannine Boga M.D.   On: 10/19/2019 01:39    ____________________________________________   PROCEDURES   Procedure(s) performed (including Critical Care):  .Critical Care Performed by: Hinda Kehr, MD Authorized by: Hinda Kehr, MD   Critical care provider statement:    Critical care time (minutes):  30   Critical care time was exclusive of:  Separately billable procedures and treating other patients   Critical care was necessary to treat or prevent imminent or life-threatening deterioration of the following conditions:  Cardiac failure   Critical care was time spent personally by me on the following activities:  Development of treatment plan with patient or surrogate, discussions with consultants, evaluation of patient's response to treatment, examination of patient, obtaining history from patient or surrogate, ordering and performing treatments and interventions, ordering and review of laboratory studies, ordering and review of radiographic studies, pulse oximetry, re-evaluation of patient's condition and review of old charts .1-3 Lead EKG Interpretation Performed by: Hinda Kehr, MD Authorized by: Hinda Kehr, MD     Interpretation: abnormal     ECG rate:  40   ECG rate assessment: bradycardic     Rhythm: other rhythm     Ectopy: none     Conduction: normal       ____________________________________________   INITIAL IMPRESSION / MDM / ASSESSMENT AND PLAN / ED COURSE  As part of my medical decision making, I reviewed the following data within the [place]  notes reviewed and incorporated, Labs reviewed , EKG interpreted , Old EKG reviewed, Old chart reviewed, Radiograph reviewed , Discussed with admitting physician (Dr. Sidney Ace), Discussed with cardiology (Drs. Rockey Situ and Nehemiah Massed) and reviewed Notes from prior ED visits   Differential diagnosis includes, but is not limited to, third-degree AV block, AV disassociation, electrolyte abnormalities, NSTEMI.  Patient is normotensive and has no sign of infection.  His symptoms are mild but worse with exertion.  His EKG is most consistent with third-degree AV block versus A-V dissociation.  Regardless he has symptomatic bradycardia.  The patient is on the cardiac monitor to evaluate for evidence of arrhythmia and/or significant heart rate changes.  We have also placed anterior and posterior chest pads and hooked him up to a Zoll in case pacing is necessary.  Lab work is pending and I will replete electrolytes as needed.  Atropine and/or dopamine will be  used if the patient becomes more symptomatic and transcutaneous pacing may be indicated.  I will contact cardiology to discuss the case and anticipate hospitalist admission with cardiology consult in the morning.     Clinical Course as of Oct 19 335  Fri Oct 19, 2019  0153 Paging Dr. Nehemiah Massed with cardiology.   [CF]  0157 I discussed the case by phone with Dr. Rockey Situ who is on for unassigned cardiology. He recommended avoiding pacing if at all possible. He agreed with my plan for atropine or dopamine if necessary. He asked me to pass along to the hospitalist that the patient should be admitted for a "washout" of his beta-blocker and calcium channel blocker for at least 5 doses before electrophysiology will even consider placing a pacemaker. He can be admitted to the hospitalist service and they will consult cardiology, preferably Dr. Saralyn Pilar since he is his cardiologist and an EP.  I have consulted the hospitalist team for admission.   [CF]  0206 Lab work  is generally reassuring. Electrolytes are within normal limits. High-sensitivity troponin is 10. Awaiting callback from Dr. Nehemiah Massed.   [CF]  [phone] Discussed case with Dr. Sidney Ace by phone and he will admit.  Also passed along cardiology recommendations.   [CF]  4098 Dr. Nehemiah Massed called me back as well; he was having issues with his pager.  He agreed with everything that was previously discussed and with the plan of care.  I updated the hospitalist, Dr. Sidney Ace, and Dr. Nehemiah Massed will be consulted for the inpatient admission.   [CF]    Clinical Course User Index [CF] Hinda Kehr, MD     ____________________________________________  FINAL CLINICAL IMPRESSION(S) / ED DIAGNOSES  Final diagnoses:  Complete heart block (H[facility])  Symptomatic bradycardia     MEDICATIONS GIVEN DURING THIS VISIT:  Medications  enoxaparin (LOVENOX) injection 40 mg (has no administration in time range)  0.9 %  sodium chloride infusion (has no administration in time range)  acetaminophen (TYLENOL) tablet 650 mg (has no administration in time range)    Or  acetaminophen (TYLENOL) suppository 650 mg (has no administration in time range)  traZODone (DESYREL) tablet 25 mg (has no administration in time range)  magnesium hydroxide (MILK OF MAGNESIA) suspension 30 mL (has no administration in time range)  ondansetron (ZOFRAN) tablet 4 mg (has no administration in time range)    Or  ondansetron (ZOFRAN) injection 4 mg (has no administration in time range)     ED Discharge Orders    None      *Please note:  Obed Samek was evaluated in Emergency Department on 10/19/2019 for the symptoms described in the history of present illness. He was evaluated in the context of the global COVID-19 pandemic, which necessitated consideration that the patient might be at risk for infection with the SARS-CoV-2 virus that causes COVID-19. Institutional protocols and algorithms that pertain to the evaluation of patients at risk for  COVID-19 are in a state of rapid change based on information released by regulatory bodies including the CDC and federal and state organizations. These policies and algorithms were followed during the patient's care in the ED.  Some ED evaluations and interventions may be delayed as a result of limited staffing during and after the pandemic.*  Note:  This document was prepared using Dragon voice recognition software and may include unintentional dictation errors.   Hinda Kehr, MD 10/19/19 [phone]

## 2019-10-19 NOTE — ED Notes (Addendum)
Pt resting in stretcher, A&O. Wife bedside. Pt states back pain is decreasing but CP is slowly increasing. NAD noted.

## 2019-10-19 NOTE — ED Notes (Signed)
Pt to cath lab.

## 2019-10-19 NOTE — H&P (Signed)
[place]   PATIENT NAME: Zachary Clements    MR#:  [id number]  DATE OF BIRTH:  09/02/1955  DATE OF ADMISSION:  10/19/2019  PRIMARY CARE PHYSICIAN: Derinda Late, MD   REQUESTING/REFERRING PHYSICIAN: Hinda Kehr, MD CHIEF COMPLAINT:   Chief Complaint  Patient presents with  . Chest Pain  . Dizziness    HISTORY OF PRESENT ILLNESS:  Zachary Clements  is a 64 y.o. Caucasian male with a known history of hypertension, hypertrophic cardiomyopathy, GERD and osteoarthritis, who presented to the emergency room with acute onset of lightheadedness and exertional dyspnea as well as chest pain. He described his chest pain as pressure and tightness in the midsternal area with associated nausea without vomiting as well as diaphoresis. He admitted to palpitations with this chest pain. He has not taken any new medications except to Neurontin about a month ago. He has been tolerating it. No fever or chills or cough or wheezing or dyspnea. He has been vaccinated for COVID-19 and has not had any recent exposure. No dysuria, oliguria or hematuria urgency or frequency or flank pain.  Upon presentation to the emergency room, heart rate was 40 with otherwise normal vital signs.  Has been as low as 35.  The patient's EKG showed marked sinus bradycardia with rate of 41 with A-V dissociation and wide QRS ask concerning for third-degree AV block, right bundle branch block.  Labs revealed unremarkable CMP.  BNP was 578.6 and high-sensitivity troponin I was 10.  CBC was within normal.Portable chest x-ray showed cardiomegaly with perihilar vascular congestion without overt pulmonary edema as well as elevation of the left hemidiaphragm with associated mild left basal atelectasis.  Cardiology consult was obtained over the phone and recommendation was for stopping beta-blocker therapy as well as calcium channel blockers, utilize as needed IV atropine and dopamine rather than pacing.  She will be seen in a.m. for  consideration of pacemaker placement if her symptomatic bradycardia is not improving.  PAST MEDICAL HISTORY:   Past Medical History:  Diagnosis Date  . Arthritis   . Dysrhythmia   . GERD (gastroesophageal reflux disease)   . Hypertension   . Hypertrophic cardiomyopathy ([place])   . Insomnia     PAST SURGICAL HISTORY:   Past Surgical History:  Procedure Laterality Date  . BACK SURGERY     rods in back  . BACK SURGERY     removal of rods in back  . colonscopy    . KNEE ARTHROSCOPY Right   . LUMBAR LAMINECTOMY/DECOMPRESSION MICRODISCECTOMY N/A 11/09/2017   Procedure: LUMBAR LAMINECTOMY/DECOMPRESSION MICRODISCECTOMY 1 LEVEL-4-5;  Surgeon: Meade Maw, MD;  Location: [facility] ORS;  Service: Neurosurgery;  Laterality: N/A;    SOCIAL HISTORY:   Social History   Tobacco Use  . Smoking status: Former Smoker    Types: Cigarettes    Quit date: 11/04/1998    Years since quitting: 20.9  . Smokeless tobacco: Never Used  Substance Use Topics  . Alcohol use: Not Currently    FAMILY HISTORY:  History reviewed. No pertinent family history.  DRUG ALLERGIES:  No Known Allergies  REVIEW OF SYSTEMS:   As per history of present illness. All pertinent systems were reviewed above. Constitutional, HEENT, cardiovascular, respiratory, GI, GU, musculoskeletal, neuro, psychiatric, endocrine, integumentary and hematologic systems were reviewed and are otherwise negative/unremarkable except for positive findings mentioned above in the HPI.   MEDICATIONS AT HOME:   Prior to Admission medications   Medication Sig Start Date End Date Taking? Authorizing  Provider  aspirin-acetaminophen-caffeine (EXCEDRIN MIGRAINE) [phone] MG tablet Take 2 tablets by mouth 2 (two) times daily as needed for headache.    [provider]  atorvastatin (LIPITOR) 10 MG tablet Take 10 mg by mouth daily with supper.    [provider]  DULoxetine (CYMBALTA) 60 MG capsule Take 120 mg by mouth daily.     [provider]  latanoprost (XALATAN) 0.005 % ophthalmic solution Place 1 drop into both eyes at bedtime.    [provider]  methocarbamol (ROBAXIN) 500 MG tablet Take 1 tablet (500 mg total) by mouth every 6 (six) hours as needed for muscle spasms. 11/09/17   Marin Olp, PA-C  metoprolol succinate (TOPROL-XL) 100 MG 24 hr tablet Take 100 mg by mouth daily with supper. Take with or immediately following a meal.    [provider]  Multiple Vitamin (MULTIVITAMIN WITH MINERALS) TABS tablet Take 1 tablet by mouth daily.    [provider]  oxyCODONE (ROXICODONE) 5 MG immediate release tablet Take 1 tablet (5 mg total) by mouth every 4 (four) hours as needed for breakthrough pain. 11/09/17   Marin Olp, PA-C  timolol (TIMOPTIC) 0.25 % ophthalmic solution Place 1 drop into both eyes daily.    [provider]  verapamil (CALAN-SR) 180 MG CR tablet Take 180 mg by mouth daily.    [provider]      VITAL SIGNS:  Blood pressure 107/71, pulse (!) 36, temperature 97.6 F (36.4 C), temperature source Oral, resp. rate 18, height 5\' 10"  (1.778 m), weight 90.7 kg, SpO2 97 %.  PHYSICAL EXAMINATION:  Physical Exam  GENERAL:  63 y.o.-year-old Caucasian male patient lying in the bed with no acute distress.  EYES: Pupils equal, round, reactive to light and accommodation. No scleral icterus. Extraocular muscles intact.  HEENT: Head atraumatic, normocephalic. Oropharynx and nasopharynx clear.  NECK:  Supple, no jugular venous distention. No thyroid enlargement, no tenderness.  LUNGS: Normal breath sounds bilaterally, no wheezing, rales,rhonchi or crepitation. No use of accessory muscles of respiration.  CARDIOVASCULAR: Regular rate bradycardic rhythm, S1, S2 normal. No murmurs, rubs, or gallops.  ABDOMEN: Soft, nondistended, nontender. Bowel sounds present. No organomegaly or mass.  EXTREMITIES: No pedal edema, cyanosis, or clubbing.  NEUROLOGIC:  Cranial nerves II through XII are intact. Muscle strength 5/5 in all extremities. Sensation intact. Gait not checked.  PSYCHIATRIC: The patient is alert and oriented x 3.  Normal affect and good eye contact. SKIN: No obvious rash, lesion, or ulcer.   LABORATORY PANEL:   CBC Recent Labs  Lab 10/19/19 0128  WBC 8.1  HGB 13.4  HCT 40.4  PLT 306   ------------------------------------------------------------------------------------------------------------------  Chemistries  Recent Labs  Lab 10/19/19 0120 10/19/19 0128  NA  --  139  K  --  4.1  CL  --  104  CO2  --  28  GLUCOSE  --  116*  BUN  --  26*  CREATININE  --  1.21  CALCIUM  --  9.2  MG 2.2  --   AST  --  21  ALT  --  19  ALKPHOS  --  42  BILITOT  --  0.7   ------------------------------------------------------------------------------------------------------------------  Cardiac Enzymes No results for input(s): TROPONINI in the last 168 hours. ------------------------------------------------------------------------------------------------------------------  RADIOLOGY:  DG Chest 1 View  Result Date: 10/19/2019 CLINICAL DATA:  Initial evaluation for acute shortness of breath. EXAM: CHEST  1 VIEW COMPARISON:  None available. FINDINGS: Mild cardiomegaly.  Mediastinal silhouette within  normal limits. Lungs mildly hypoinflated with elevation left hemidiaphragm. Mild streaky left basilar atelectasis. Perihilar vascular congestion without overt pulmonary edema. No pleural effusion. No airspace consolidation. No pneumothorax. No acute osseous finding. IMPRESSION: 1. Cardiomegaly with perihilar vascular congestion without overt pulmonary edema. 2. Elevation of the left hemidiaphragm with associated mild left basilar atelectasis. Electronically Signed   By: Jeannine Boga M.D.   On: 10/19/2019 01:39      IMPRESSION AND PLAN:   1.  Symptomatic bradycardia with associated third-degree AV block. -The patient will be  admitted to a progressive cardiac unit bed. -We will stop her Toprol-XL and Calan SR. -She has pacer pads but will avoid pacing as much as we can. -We will utilize as needed IV atropine and dopamine per cardiology advice. -Cardiology consultation will be obtained. -Dr. Nehemiah Massed is aware about the patient.  2.  Chest pain, likely secondary to symptomatic bradycardia. -We will follow serial cardiac enzymes. -Cardiology consultation will be obtained in a.m. for further cardiac risk stratification. -The patient will be placed on as needed IV morphine sulfate.  3.  Dyslipidemia. -Statin therapy will be resumed.  4.  Peripheral neuropathy. -Neurontin will be continued.  6.  Glaucoma. -Her ophthalmic gtt. will be continued.  7.  DVT prophylaxis. -Subcutaneous Lovenox.   All the records are reviewed and case discussed with ED provider. The plan of care was discussed in details with the patient (and family). I answered all questions. The patient agreed to proceed with the above mentioned plan. Further management will depend upon hospital course.   CODE STATUS: Full code  Status is: Inpatient  Remains inpatient appropriate because:Hemodynamically unstable, Ongoing active pain requiring inpatient pain management, Ongoing diagnostic testing needed not appropriate for outpatient work up, Unsafe d/c plan, IV treatments appropriate due to intensity of illness or inability to take PO and Inpatient level of care appropriate due to severity of illness   Dispo: The patient is from: Home              Anticipated d/c is to: Home              Anticipated d/c date is: 2 days              Patient currently is not medically stable to d/c.   TOTAL TIME TAKING CARE OF THIS PATIENT: 55 minutes.    Christel Mormon M.D on 10/19/2019 at 3:25 AM  [facility]   From 7 PM-7 AM, contact night-coverage [email or web address]  CC: Primary care physician; Derinda Late, MD   Note: This dictation was  prepared with Dragon dictation along with smaller phrase technology. Any transcriptional typo errors that result from this process are unintentional.

## 2019-10-19 NOTE — ED Notes (Signed)
Spoke with Caryl Pina in Cath lab and gave update on pt.

## 2019-10-19 NOTE — ED Notes (Signed)
Dr. Saunders Revel and Dr. Gigi Gin at bedside

## 2019-10-20 ENCOUNTER — Inpatient Hospital Stay
Admit: 2019-10-20 | Discharge: 2019-10-20 | Disposition: A | Discharge status: Home / Self Care | Payer: Medicare Other | Attending: Internal Medicine | Admitting: Internal Medicine

## 2019-10-20 LAB — BASIC METABOLIC PANEL
Anion gap: 6 (ref 5–15)
BUN: 26 mg/dL — ABNORMAL HIGH (ref 8–23)
CO2: 26 mmol/L (ref 22–32)
Calcium: 8.4 mg/dL — ABNORMAL LOW (ref 8.9–10.3)
Chloride: 106 mmol/L (ref 98–111)
Creatinine, Ser: 1.16 mg/dL (ref 0.61–1.24)
GFR calc Af Amer: >60 mL/min (ref >60)
GFR calc non Af Amer: >60 mL/min (ref >60)
Glucose, Bld: 100 mg/dL — ABNORMAL HIGH (ref 70–99)
Potassium: 4.1 mmol/L (ref 3.5–5.1)
Sodium: 138 mmol/L (ref 135–145)

## 2019-10-20 LAB — CBC
HCT: 35.1 % — ABNORMAL LOW (ref 39.0–52.0)
Hemoglobin: 11.7 g/dL — ABNORMAL LOW (ref 13.0–17.0)
MCH: 32.7 pg (ref 26.0–34.0)
MCHC: 33.3 g/dL (ref 30.0–36.0)
MCV: 98 fL (ref 80.0–100.0)
Platelets: 255 10*3/uL (ref 150–400)
RBC: 3.58 MIL/uL — ABNORMAL LOW (ref 4.22–5.81)
RDW: 13 % (ref 11.5–15.5)
WBC: 9.3 10*3/uL (ref 4.0–10.5)
nRBC: 0 % (ref 0.0–0.2)

## 2019-10-20 LAB — MAGNESIUM: Magnesium: 2.2 mg/dL (ref 1.7–2.4)

## 2019-10-20 MED ORDER — PERFLUTREN LIPID MICROSPHERE
2.0000 mL | INTRAVENOUS | Status: AC | PRN
  Administered 2019-10-20: 2 mL via INTRAVENOUS
  Filled 2019-10-20: qty 2

## 2019-10-20 NOTE — Plan of Care (Signed)
Pt verbalized understanding of discharge instructions, no complaints, no signs of distress, ready to go home

## 2019-10-20 NOTE — Discharge Summary (Signed)
Physician Discharge Summary  Zachary Clements GYI:[id number] DOB: 12-25-1955 DOA: 10/19/2019  PCP: Derinda Late, MD  Admit date: 10/19/2019 Discharge date: 10/20/2019  Admitted From: home Disposition:  home  Recommendations for Outpatient Follow-up:  1. Follow up with PCP in 1-2 weeks 2. F/u cardio, Dr. Saralyn Pilar, in 1 week   Home Health: no Equipment/Devices:  Discharge Condition: stable CODE STATUS: full  Diet recommendation: Heart Healthy    Brief/Interim Summary: HPI was taken from Dr. Sidney Ace: Zachary Clements  is a 64 y.o. Caucasian male with a known history of hypertension, hypertrophic cardiomyopathy, GERD and osteoarthritis, who presented to the emergency room with acute onset of lightheadedness and exertional dyspnea as well as chest pain. He described his chest pain as pressure and tightness in the midsternal area with associated nausea without vomiting as well as diaphoresis. He admitted to palpitations with this chest pain. He has not taken any new medications except to Neurontin about a month ago. He has been tolerating it. No fever or chills or cough or wheezing or dyspnea. He has been vaccinated for COVID-19 and has not had any recent exposure. No dysuria, oliguria or hematuria urgency or frequency or flank pain.  Upon presentation to the emergency room, heart rate was 40 with otherwise normal vital signs.  Has been as low as 35.  The patient's EKG showed marked sinus bradycardia with rate of 41 with A-V dissociation and wide QRS ask concerning for third-degree AV block, right bundle branch block.  Labs revealed unremarkable CMP.  BNP was 578.6 and high-sensitivity troponin I was 10.  CBC was within normal.Portable chest x-ray showed cardiomegaly with perihilar vascular congestion without overt pulmonary edema as well as elevation of the left hemidiaphragm with associated mild left basal atelectasis.  Cardiology consult was obtained over the phone and recommendation was for  stopping beta-blocker therapy as well as calcium channel blockers, utilize as needed IV atropine and dopamine rather than pacing.  She will be seen in a.m. for consideration of pacemaker placement if her symptomatic bradycardia is not improving.   Hospital Course from Dr. Lenise Herald 8/27-8/28/21: Pt presented w/ symptomatic bradycardia as low as the 20s. Pt had a temporary transvenous pacemaker placed emergently by cardio on 10/19/19 and then it was d/c 10/20/19. Pt was able to be weaned off of dopamine as well as phenylephrine drips prior to d/c. Metoprolol, verapamil were both d/c. Pt will f/u w/ cardio, Dr. Saralyn Pilar, in 1 week. Pt verbalized his understanding. For more information, please see other progress notes.   Discharge Diagnoses:  Active Problems:   Complete heart block (HCC)   CHB (complete heart block) (HCC)   Chest pain   Other hyperlipidemia   Idiopathic peripheral neuropathy   Symptomatic bradycardia: w/ possible third-degree AV block. Continue to hold metoprolol, verapamil. IV atropine prn. Continue on tele. S/p temporary transvenous pacemaker placement 10/19/19 as per cardio. Weaning off of dopamine and phenylephrine as per cardio. Cardio recs apprec. Resolved   Chest pain: etiology unclear, likely secondary to symptomatic bradycardia. Troponins neg x 2.  HLD: will continue on statin   Peripheral neuropathy: will continue on home dose of gabapentin    Glaucoma: will hold home dose of timolol eye drops at d/c  Discharge Instructions  Discharge Instructions    Diet - low sodium heart healthy   Complete by: As directed    Discharge instructions   Complete by: As directed    F/u w/ cardio, Dr. Saralyn Pilar, in 1 week. F/u PCP in 1-2  weeks   Increase activity slowly   Complete by: As directed      Allergies as of 10/20/2019   No Known Allergies     Medication List    STOP taking these medications   metoprolol succinate 100 MG 24 hr tablet Commonly known as:  TOPROL-XL   verapamil 180 MG CR tablet Commonly known as: CALAN-SR     TAKE these medications   atorvastatin 10 MG tablet Commonly known as: LIPITOR Take 10 mg by mouth daily with supper.   DULoxetine 60 MG capsule Commonly known as: CYMBALTA Take 120 mg by mouth daily.   gabapentin 300 MG capsule Commonly known as: NEURONTIN Take 1 capsule by mouth in the morning, at noon, and at bedtime.   multivitamin with minerals Tabs tablet Take 1 tablet by mouth daily.   timolol 0.5 % ophthalmic solution Commonly known as: TIMOPTIC Place 1 drop into both eyes daily.   traZODone 50 MG tablet Commonly known as: DESYREL Take 1 tablet by mouth at bedtime.       Follow-up Information    Paraschos, Alexander, MD Follow up in 1 week(s).   Specialty: Cardiology Contact information: [facility] [place] [address] [phone]              No Known Allergies  Consultations:  cardio   Procedures/Studies: DG Chest 1 View  Result Date: 10/19/2019 CLINICAL DATA:  Initial evaluation for acute shortness of breath. EXAM: CHEST  1 VIEW COMPARISON:  None available. FINDINGS: Mild cardiomegaly.  Mediastinal silhouette within normal limits. Lungs mildly hypoinflated with elevation left hemidiaphragm. Mild streaky left basilar atelectasis. Perihilar vascular congestion without overt pulmonary edema. No pleural effusion. No airspace consolidation. No pneumothorax. No acute osseous finding. IMPRESSION: 1. Cardiomegaly with perihilar vascular congestion without overt pulmonary edema. 2. Elevation of the left hemidiaphragm with associated mild left basilar atelectasis. Electronically Signed   By: Jeannine Boga M.D.   On: 10/19/2019 01:39   CARDIAC CATHETERIZATION  Result Date: 10/19/2019 Conclusions: 1. Mild, non-obstructive coronary artery disease with 20% mid RCA stenosis.  No significant CAD noted in the left coronary artery. 2.  Hyperdynamic left ventricular contraction with mild resting LVOT gradient (~10 mmHg) and mildly elevated filling pressure (LVEDP 20-25 mmHg). 3. Successful placement of temporary transvenous pacemaker into the right ventricle via the right internal jugular artery. Recommendations: 1. Transfer to ICU for monitoring and titration of pressors.  I have transitioned from dopamine to norepinephrine in order to minimize inotropic and heart rate effects in the setting of HOCM. 2. Primary prevention of coronary artery. 3. Permanent pacemaker will need to be considered if high-grade AV block persists after washout of AV-nodal blocking agents. Nelva Bush, MD [facility]   DG CHEST PORT 1 VIEW  Result Date: 10/19/2019 CLINICAL DATA:  Complete heart block EXAM: PORTABLE CHEST 1 VIEW COMPARISON:  06/19/2019 FINDINGS: Right central line tip is at the cavoatrial junction. No pneumothorax. Cardiomegaly, mild vascular congestion. No confluent opacities, effusions or edema. No acute bony abnormality. IMPRESSION: Mild cardiomegaly, vascular congestion. Electronically Signed   By: Rolm Baptise M.D.   On: 10/19/2019 18:10   DG Chest Portable 1 View  Result Date: 10/19/2019 CLINICAL DATA:  Chest pain EXAM: PORTABLE CHEST 1 VIEW COMPARISON:  October 19, 2019 study obtained earlier in the day FINDINGS: There is no edema or airspace opacity. Heart is borderline prominent with mild pulmonary venous hypertension. No evident adenopathy. No pneumothorax. No bone lesions. IMPRESSION: Borderline cardiomegaly  with a degree of pulmonary vascular congestion. Lungs clear. Electronically Signed   By: Lowella Grip III M.D.   On: 10/19/2019 09:22       Subjective: Pt c/o fatigue but denies any chest pain    Discharge Exam: Vitals:   10/20/19 1000 10/20/19 1430  BP: 136/89 123/78  Pulse: 81 78  Resp: (!) 21 14  Temp:    SpO2: 98% 97%   Vitals:   10/20/19 0836 10/20/19 0900 10/20/19 1000 10/20/19 1430  BP:  129/85  136/89 123/78  Pulse: 74 77 81 78  Resp: 13 13 (!) 21 14  Temp:      TempSrc:      SpO2:  95% 98% 97%  Weight:      Height:        General: Pt is alert, awake, not in acute distress Cardiovascular: S1/S2 +, no rubs, no gallops Respiratory: CTA bilaterally, no wheezing, no rhonchi Abdominal: Soft, NT, ND, bowel sounds + Extremities: no edema, no cyanosis    The results of significant diagnostics from this hospitalization (including imaging, microbiology, ancillary and laboratory) are listed below for reference.     Microbiology: Recent Results (from the past 240 hour(s))  SARS Coronavirus 2 by RT PCR (hospital order, performed in [facility] hospital lab) Nasopharyngeal Nasopharyngeal Swab     Status: None   Collection Time: 10/19/19  1:53 AM   Specimen: Nasopharyngeal Swab  Result Value Ref Range Status   SARS Coronavirus 2 NEGATIVE NEGATIVE Final    Comment: (NOTE) SARS-CoV-2 target nucleic acids are NOT DETECTED.  The SARS-CoV-2 RNA is generally detectable in upper and lower respiratory specimens during the acute phase of infection. The lowest concentration of SARS-CoV-2 viral copies this assay can detect is 250 copies / mL. A negative result does not preclude SARS-CoV-2 infection and should not be used as the sole basis for treatment or other patient management decisions.  A negative result may occur with improper specimen collection / handling, submission of specimen other than nasopharyngeal swab, presence of viral mutation(s) within the areas targeted by this assay, and inadequate number of viral copies (<250 copies / mL). A negative result must be combined with clinical observations, patient history, and epidemiological information.  Fact Sheet for Patients:   StrictlyIdeas.no  Fact Sheet for Healthcare Providers: BankingDealers.co.za  This test is not yet approved or  cleared by the [place] FDA and has been  authorized for detection and/or diagnosis of SARS-CoV-2 by FDA under an Emergency Use Authorization (EUA).  This EUA will remain in effect (meaning this test can be used) for the duration of the COVID-19 declaration under Section 564(b)(1) of the Act, 21 U.S.C. section 360bbb-3(b)(1), unless the authorization is terminated or revoked sooner.  Performed at [facility], [address] [place], [place] [address]   MRSA PCR Screening     Status: None   Collection Time: 10/19/19 12:28 PM   Specimen: Nasopharyngeal  Result Value Ref Range Status   MRSA by PCR NEGATIVE NEGATIVE Final    Comment:        The GeneXpert MRSA Assay (FDA approved for NASAL specimens only), is one component of a comprehensive MRSA colonization surveillance program. It is not intended to diagnose MRSA infection nor to guide or monitor treatment for MRSA infections. Performed at [facility], [address] [place], [place] [address]      Labs: BNP (last 3 results) Recent Labs    10/19/19 0128  BNP 578.6*  Basic Metabolic Panel: Recent Labs  Lab 10/19/19 0120 10/19/19 0128 10/19/19 0443 10/20/19 0449  NA  --  139 137 138  K  --  4.1 4.6 4.1  CL  --  104 103 106  CO2  --  28 26 26   GLUCOSE  --  116* 110* 100*  BUN  --  26* 25* 26*  CREATININE  --  1.21 1.03 1.16  CALCIUM  --  9.2 8.8* 8.4*  MG 2.2  --   --  2.2   Liver Function Tests: Recent Labs  Lab 10/19/19 0128  AST 21  ALT 19  ALKPHOS 42  BILITOT 0.7  PROT 7.0  ALBUMIN 3.9   No results for input(s): LIPASE, AMYLASE in the last 168 hours. No results for input(s): AMMONIA in the last 168 hours. CBC: Recent Labs  Lab 10/19/19 0128 10/19/19 0443 10/20/19 0449  WBC 8.1 7.1 9.3  NEUTROABS 4.6  --   --   HGB 13.4 13.0 11.7*  HCT 40.4 37.2* 35.1*  MCV 97.3 95.4 98.0  PLT 306 293 255   Cardiac Enzymes: No results for input(s): CKTOTAL, CKMB, CKMBINDEX, TROPONINI in the last 168 hours. BNP: Invalid  input(s): POCBNP CBG: Recent Labs  Lab 10/19/19 1217  GLUCAP 151*   D-Dimer No results for input(s): DDIMER in the last 72 hours. Hgb A1c No results for input(s): HGBA1C in the last 72 hours. Lipid Profile No results for input(s): CHOL, HDL, LDLCALC, TRIG, CHOLHDL, LDLDIRECT in the last 72 hours. Thyroid function studies No results for input(s): TSH, T4TOTAL, T3FREE, THYROIDAB in the last 72 hours.  Invalid input(s): FREET3 Anemia work up No results for input(s): VITAMINB12, FOLATE, FERRITIN, TIBC, IRON, RETICCTPCT in the last 72 hours. Urinalysis    Component Value Date/Time   COLORURINE AMBER (A) 11/03/2017 1106   APPEARANCEUR HAZY (A) 11/03/2017 1106   LABSPEC 1.021 11/03/2017 1106   PHURINE 5.0 11/03/2017 1106   GLUCOSEU NEGATIVE 11/03/2017 1106   HGBUR NEGATIVE 11/03/2017 1106   BILIRUBINUR NEGATIVE 11/03/2017 1106   KETONESUR NEGATIVE 11/03/2017 1106   PROTEINUR 30 (A) 11/03/2017 1106   NITRITE NEGATIVE 11/03/2017 1106   LEUKOCYTESUR NEGATIVE 11/03/2017 1106   Sepsis Labs Invalid input(s): PROCALCITONIN,  WBC,  LACTICIDVEN Microbiology Recent Results (from the past 240 hour(s))  SARS Coronavirus 2 by RT PCR (hospital order, performed in [place] hospital lab) Nasopharyngeal Nasopharyngeal Swab     Status: None   Collection Time: 10/19/19  1:53 AM   Specimen: Nasopharyngeal Swab  Result Value Ref Range Status   SARS Coronavirus 2 NEGATIVE NEGATIVE Final    Comment: (NOTE) SARS-CoV-2 target nucleic acids are NOT DETECTED.  The SARS-CoV-2 RNA is generally detectable in upper and lower respiratory specimens during the acute phase of infection. The lowest concentration of SARS-CoV-2 viral copies this assay can detect is 250 copies / mL. A negative result does not preclude SARS-CoV-2 infection and should not be used as the sole basis for treatment or other patient management decisions.  A negative result may occur with improper specimen collection / handling,  submission of specimen other than nasopharyngeal swab, presence of viral mutation(s) within the areas targeted by this assay, and inadequate number of viral copies (<250 copies / mL). A negative result must be combined with clinical observations, patient history, and epidemiological information.  Fact Sheet for Patients:   StrictlyIdeas.no  Fact Sheet for Healthcare Providers: BankingDealers.co.za  This test is not yet approved or  cleared by the [place] FDA  and has been authorized for detection and/or diagnosis of SARS-CoV-2 by FDA under an Emergency Use Authorization (EUA).  This EUA will remain in effect (meaning this test can be used) for the duration of the COVID-19 declaration under Section 564(b)(1) of the Act, 21 U.S.C. section 360bbb-3(b)(1), unless the authorization is terminated or revoked sooner.  Performed at [facility], [address] [place], [place] [address]   MRSA PCR Screening     Status: None   Collection Time: 10/19/19 12:28 PM   Specimen: Nasopharyngeal  Result Value Ref Range Status   MRSA by PCR NEGATIVE NEGATIVE Final    Comment:        The GeneXpert MRSA Assay (FDA approved for NASAL specimens only), is one component of a comprehensive MRSA colonization surveillance program. It is not intended to diagnose MRSA infection nor to guide or monitor treatment for MRSA infections. Performed at [facility], [address], [place], [place] [address]      Time coordinating discharge: Over 30 minutes  SIGNED:   Wyvonnia Dusky, MD  [facility] 10/20/2019, 2:43 PM Pager   If 7PM-7AM, please contact night-coverage [email or web address]

## 2019-10-20 NOTE — Progress Notes (Signed)
[facility] Encounter Note  Patient: Zachary Clements / Admit Date: 10/19/2019 / Date of Encounter: 10/20/2019, 10:18 AM   Subjective: Patient feels much better today with no evidence of chest pain shortness of breath weakness fatigue dizziness or syncope. Cardiac catheterization showing hypertrophic obstructive cardiomyopathy with very minimal gradient and normal coronary arteries Temporary pacemaker placed due to complete heart block and/or symptomatic bradycardia likely secondary to significant amount of beta-blocking and AV node blocking medications now completely washed out and not causing any issues Temporary pacemaker set at 60 bpm and demand and not needing to be used at this time for over 24 hours with no evidence of telemetry heart block or advanced bradycardia Minimal elevation of troponin consistent with demand ischemia of significant episodes of above Patient had evidence of exacerbation of symptoms listed above possibly due to other vagal issues yesterday with abdominal discomfort and bladder distention now completely resolved Patient feels well and wishes to discuss discharge  Review of Systems: Positive for: None Negative for: Vision change, hearing change, syncope, dizziness, nausea, vomiting,diarrhea, bloody stool, stomach pain, cough, congestion, diaphoresis, urinary frequency, urinary pain,skin lesions, skin rashes Others previously listed  Objective: Telemetry: Normal sinus rhythm Physical Exam: Blood pressure 136/79, pulse 74, temperature 97.9 F (36.6 C), temperature source Oral, resp. rate 13, height 5\' 10"  (1.778 m), weight 86.7 kg, SpO2 96 %. Body mass index is 27.43 kg/m. General: Well developed, well nourished, in no acute distress. Head: Normocephalic, atraumatic, sclera non-icteric, no xanthomas, nares are without discharge. Neck: No apparent masses Lungs: Normal respirations with no wheezes, no rhonchi, no rales , no crackles   Heart:  Regular rate and rhythm, normal S1 S2, 2-3+ left sternal border murmur, no rub, no gallop, PMI is normal size and placement, carotid upstroke normal without bruit, jugular venous pressure normal Abdomen: Soft, non-tender, non-distended with normoactive bowel sounds. No hepatosplenomegaly. Abdominal aorta is normal size without bruit Extremities: No edema, no clubbing, no cyanosis, no ulcers,  Peripheral: 2+ radial, 2+ femoral, 2+ dorsal pedal pulses Neuro: Alert and oriented. Moves all extremities spontaneously. Psych:  Responds to questions appropriately with a normal affect.   Intake/Output Summary (Last 24 hours) at 10/20/2019 1018 Last data filed at 10/20/2019 0650 Gross per 24 hour  Intake 112.03 ml  Output 2300 ml  Net -2187.97 ml    Inpatient Medications:  . acetaminophen  1,000 mg Oral Q6H  . Chlorhexidine Gluconate Cloth  6 each Topical Daily  . enoxaparin (LOVENOX) injection  40 mg Subcutaneous Q24H  . ketorolac  15 mg Intravenous Q6H   Infusions:  . sodium chloride      Labs: Recent Labs    10/19/19 0120 10/19/19 0128 10/19/19 0443 10/20/19 0449  NA  --    < > 137 138  K  --    < > 4.6 4.1  CL  --    < > 103 106  CO2  --    < > 26 26  GLUCOSE  --    < > 110* 100*  BUN  --    < > 25* 26*  CREATININE  --    < > 1.03 1.16  CALCIUM  --    < > 8.8* 8.4*  MG 2.2  --   --  2.2   < > = values in this interval not displayed.   Recent Labs    10/19/19 0128  AST 21  ALT 19  ALKPHOS 42  BILITOT 0.7  PROT 7.0  ALBUMIN 3.9   Recent Labs    10/19/19 0128 10/19/19 0128 10/19/19 0443 10/20/19 0449  WBC 8.1   < > 7.1 9.3  NEUTROABS 4.6  --   --   --   HGB 13.4   < > 13.0 11.7*  HCT 40.4   < > 37.2* 35.1*  MCV 97.3   < > 95.4 98.0  PLT 306   < > 293 255   < > = values in this interval not displayed.   No results for input(s): CKTOTAL, CKMB, TROPONINI in the last 72 hours. Invalid input(s): POCBNP No results for input(s): HGBA1C in the last 72 hours.    Weights: Filed Weights   10/19/19 0115 10/19/19 1230  Weight: 90.7 kg 86.7 kg     Radiology/Studies:  DG Chest 1 View  Result Date: 10/19/2019 CLINICAL DATA:  Initial evaluation for acute shortness of breath. EXAM: CHEST  1 VIEW COMPARISON:  None available. FINDINGS: Mild cardiomegaly.  Mediastinal silhouette within normal limits. Lungs mildly hypoinflated with elevation left hemidiaphragm. Mild streaky left basilar atelectasis. Perihilar vascular congestion without overt pulmonary edema. No pleural effusion. No airspace consolidation. No pneumothorax. No acute osseous finding. IMPRESSION: 1. Cardiomegaly with perihilar vascular congestion without overt pulmonary edema. 2. Elevation of the left hemidiaphragm with associated mild left basilar atelectasis. Electronically Signed   By: Jeannine Boga M.D.   On: 10/19/2019 01:39   CARDIAC CATHETERIZATION  Result Date: 10/19/2019 Conclusions: 1. Mild, non-obstructive coronary artery disease with 20% mid RCA stenosis.  No significant CAD noted in the left coronary artery. 2. Hyperdynamic left ventricular contraction with mild resting LVOT gradient (~10 mmHg) and mildly elevated filling pressure (LVEDP 20-25 mmHg). 3. Successful placement of temporary transvenous pacemaker into the right ventricle via the right internal jugular artery. Recommendations: 1. Transfer to ICU for monitoring and titration of pressors.  I have transitioned from dopamine to norepinephrine in order to minimize inotropic and heart rate effects in the setting of HOCM. 2. Primary prevention of coronary artery. 3. Permanent pacemaker will need to be considered if high-grade AV block persists after washout of AV-nodal blocking agents. Nelva Bush, MD [facility]   DG CHEST PORT 1 VIEW  Result Date: 10/19/2019 CLINICAL DATA:  Complete heart block EXAM: PORTABLE CHEST 1 VIEW COMPARISON:  06/19/2019 FINDINGS: Right central line tip is at the cavoatrial junction. No  pneumothorax. Cardiomegaly, mild vascular congestion. No confluent opacities, effusions or edema. No acute bony abnormality. IMPRESSION: Mild cardiomegaly, vascular congestion. Electronically Signed   By: Rolm Baptise M.D.   On: 10/19/2019 18:10   DG Chest Portable 1 View  Result Date: 10/19/2019 CLINICAL DATA:  Chest pain EXAM: PORTABLE CHEST 1 VIEW COMPARISON:  October 19, 2019 study obtained earlier in the day FINDINGS: There is no edema or airspace opacity. Heart is borderline prominent with mild pulmonary venous hypertension. No evident adenopathy. No pneumothorax. No bone lesions. IMPRESSION: Borderline cardiomegaly with a degree of pulmonary vascular congestion. Lungs clear. Electronically Signed   By: Lowella Grip III M.D.   On: 10/19/2019 09:22     Assessment and Recommendation  64 y.o. male with known hypertrophic cardiomyopathy and hypertension on appropriate medications previously but with very little follow-up and therefore had a significant bradycardia secondary to medication management without evidence of acute coronary syndrome or significant congestive heart failure with having supportive care of temporary pacemaker with reveal of normal coronary arteries by cardiac catheterization and no further episodes of bradycardia or heart block since 24 hours  prior and now feeling quite well 1.  Abstain from beta-blocker calcium channel blocker or AV node blocking agents which has caused symptomatic bradycardia now resolved 2.  Discontinuation and removal of temporary pacemaker wire today 3.  Observe for the next several hours for any further issues of symptomatic bradycardia with ambulation 4.  No further cardiac diagnostics necessary at this time 5.  If ambulating well with no further significant symptoms okay for discharge home from cardiac standpoint with follow-up with Dr. Saralyn Pilar in 1 week for further adjustments of medication management 6.  Call if further questions otherwise will  assume patient has ambulated well and has been discharged  Signed, Serafina Royals M.D. FACC

## 2019-10-20 NOTE — Progress Notes (Signed)
Pt ambulatory without assist, left with wife.  Pt refused escort to hospital entrance via wheelchair.  Insisted on ambulating.  Pt shows no signs of distress, reports feeling improved with ambulation.

## 2019-10-21 LAB — ECHOCARDIOGRAM COMPLETE
AR max vel: 2.89 cm2
AV Area VTI: 2.66 cm2
AV Area mean vel: 2.81 cm2
AV Mean grad: 15 mmHg
AV Peak grad: 22.8 mmHg
Ao pk vel: 2.39 m/s
Area-P 1/2: 11.32 cm2
Height: 70 in
MV M vel: 6.37 m/s
MV Peak grad: 162.1 mmHg
S' Lateral: 3.06 cm
Weight: 3058.22 oz

## 2019-10-22 ENCOUNTER — Encounter: Payer: Self-pay | Admitting: Internal Medicine

## 2019-10-24 LAB — COLOGUARD: COLOGUARD: POSITIVE — AB

## 2019-10-26 ENCOUNTER — Ambulatory Visit
Admission: RE | Admit: 2019-10-26 | Discharge: 2019-10-26 | Disposition: A | Discharge status: Home / Self Care | Payer: Medicare Other | Source: Ambulatory Visit | Attending: Cardiology | Admitting: Cardiology

## 2019-10-26 ENCOUNTER — Other Ambulatory Visit: Payer: Self-pay | Admitting: Cardiology

## 2019-10-26 ENCOUNTER — Other Ambulatory Visit: Payer: Self-pay

## 2019-10-26 DIAGNOSIS — R0902 Hypoxemia: Secondary | ICD-10-CM

## 2019-10-26 IMAGING — CT CT ANGIO CHEST
2 of 6 series · 18 of 46 positions shown · IV contrast (APPLIED)
Comparison: [DATE] and prior radiographs

CLINICAL DATA: 64-year-old male with acute shortness of breath and
chest pain.

EXAM:
CT ANGIOGRAPHY CHEST WITH CONTRAST
TECHNIQUE: Multidetector CT imaging of the chest was performed using the
standard protocol during bolus administration of intravenous
contrast. Multiplanar CT image reconstructions and MIPs were
obtained to evaluate the vascular anatomy.
CONTRAST:  75mL OMNIPAQUE IOHEXOL 350 MG/ML SOLN

[Series 5: thins · axial · 0.65mm/px · z∈[-92,+174]mm · 15 of 292 slices shown]
[im 13/292  lung]
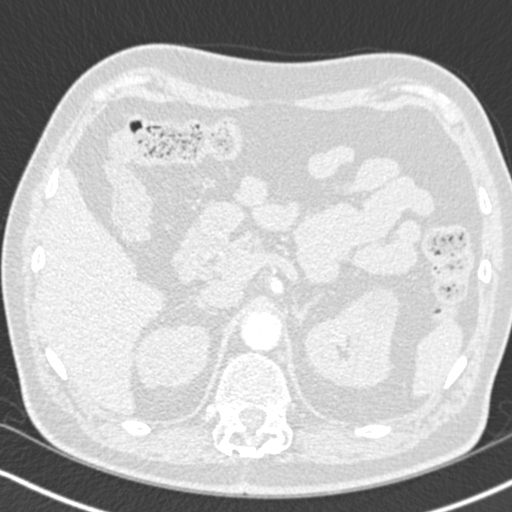
[im 38/292  soft-tissue]
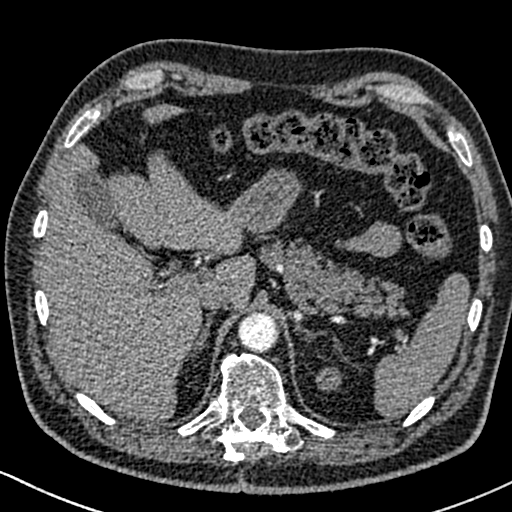
[im 51/292  lung]
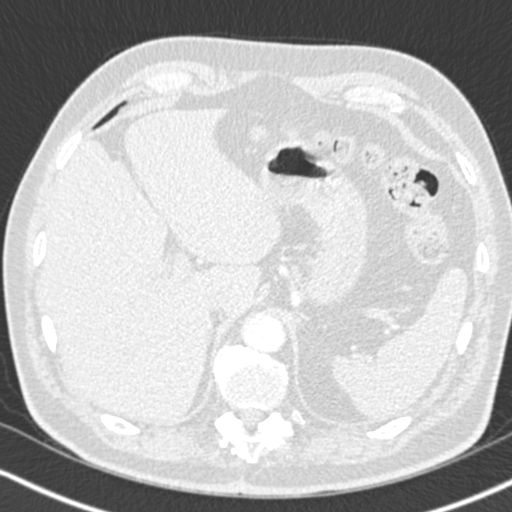
[im 76/292  soft-tissue]
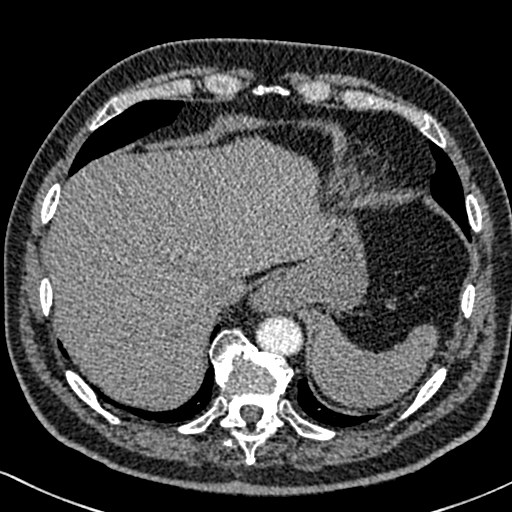
[im 89/292  lung]
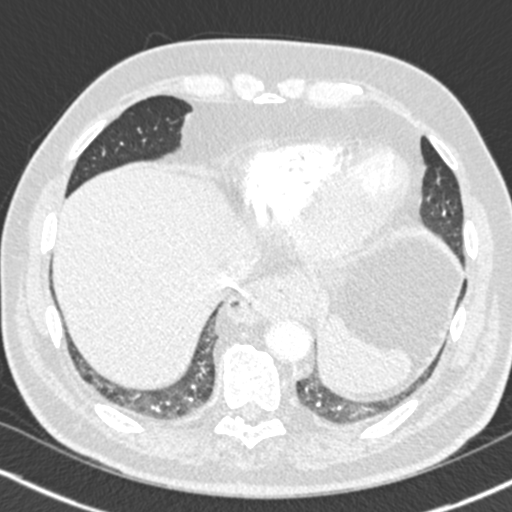
[im 114/292  soft-tissue]
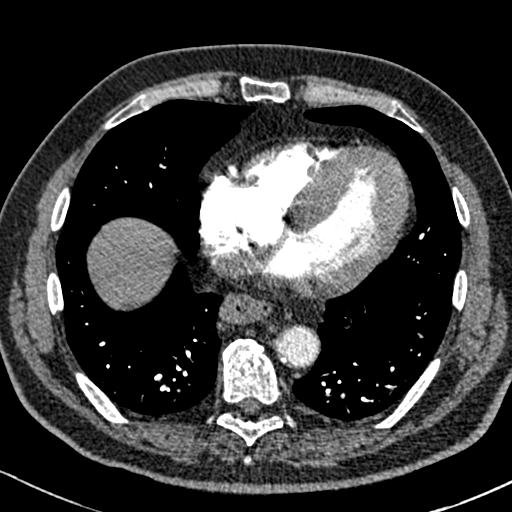
[im 127/292  lung]
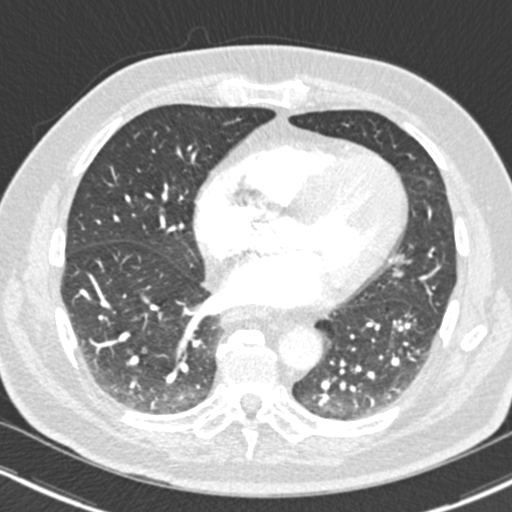
[im 152/292  soft-tissue]
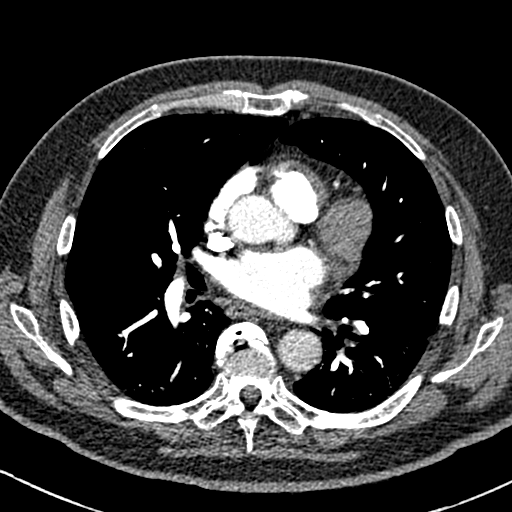
[im 165/292  lung]
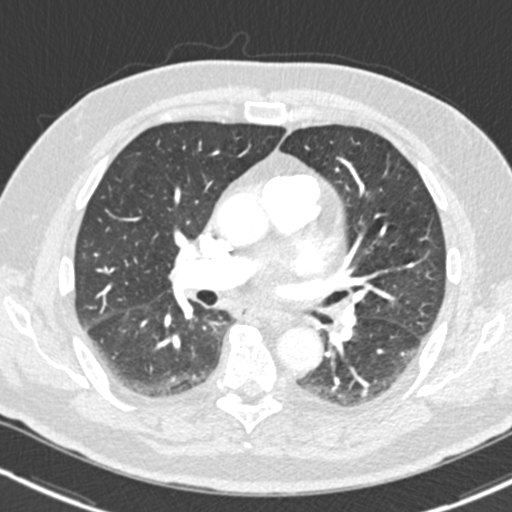
[im 178/292  soft-tissue]
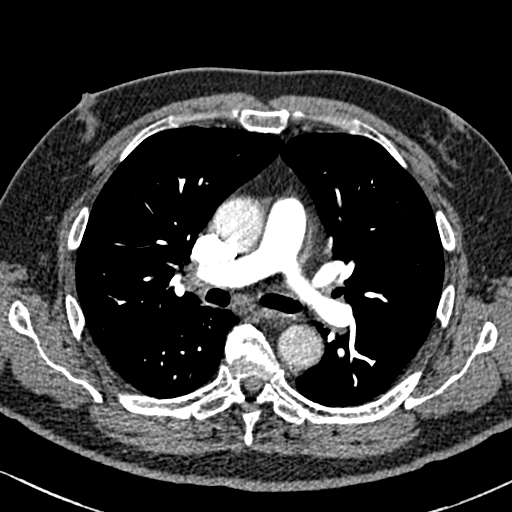
[im 203/292  lung]
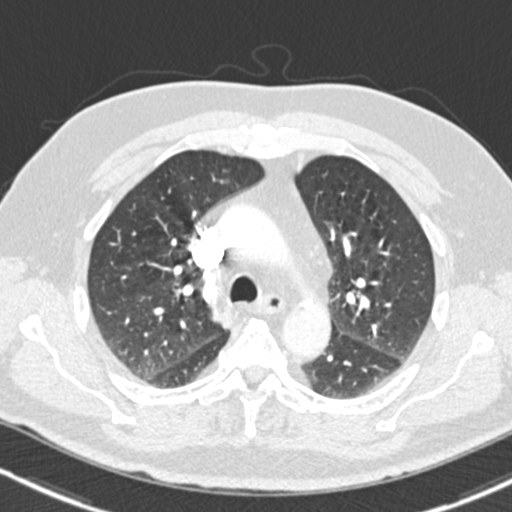
[im 216/292  soft-tissue]
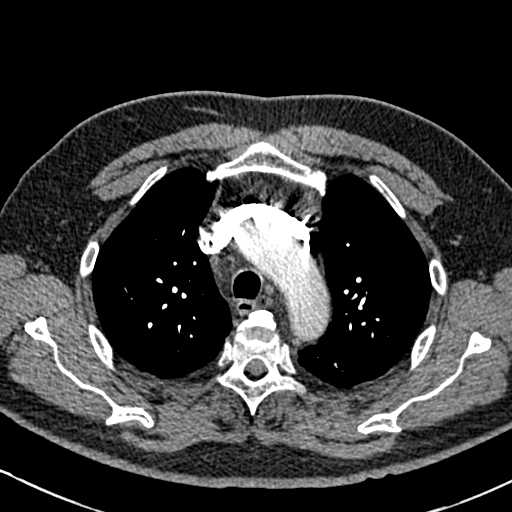
[im 241/292  lung]
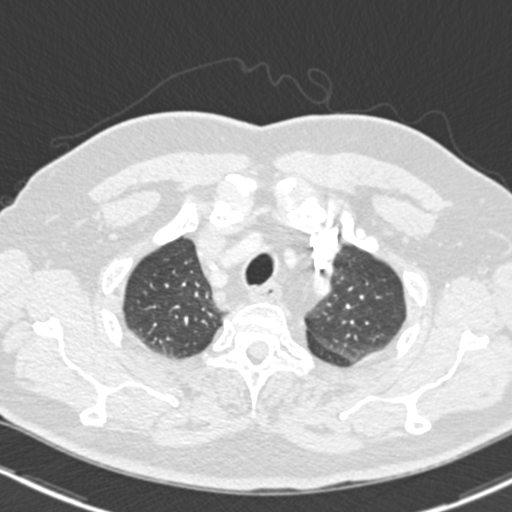
[im 254/292  soft-tissue]
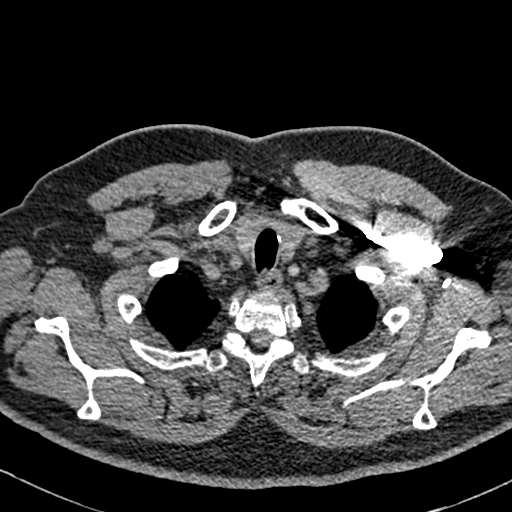
[im 279/292  lung]
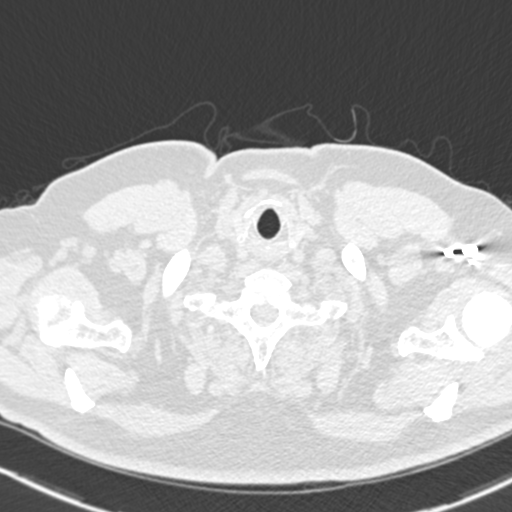

[Series 7: coronal mpr · coronal · 0.57mm/px · 3 of 106 slices shown]
[im 27/106  soft-tissue]
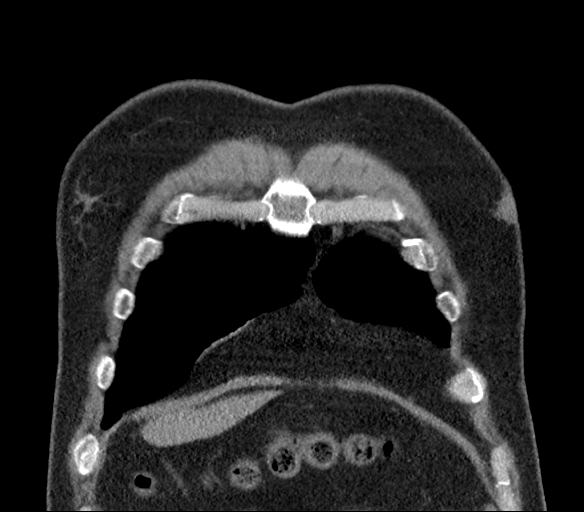
[im 53/106  soft-tissue]
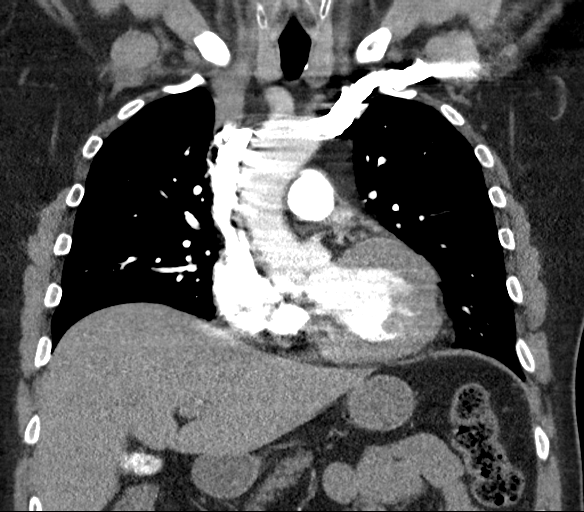
[im 79/106  soft-tissue]
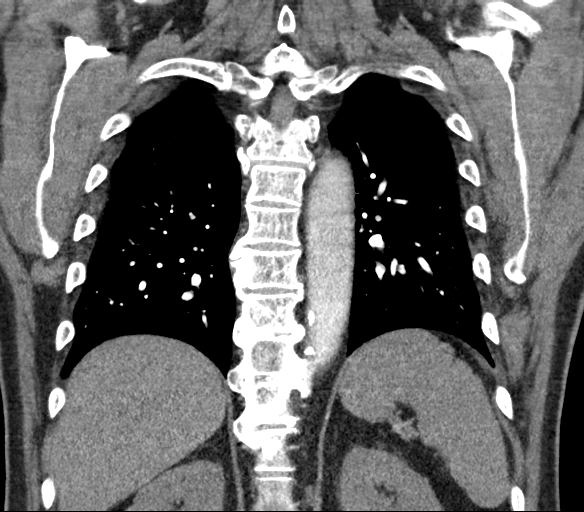

[18 of 46 positions shown; findings below may reference images not displayed]

FINDINGS: Cardiovascular: This is a technically satisfactory study. No
pulmonary emboli are identified. There is no evidence of thoracic
aortic aneurysm or pericardial effusion. UPPER limits normal heart
size noted. Mild coronary artery and aortic atherosclerotic
calcifications are noted.

Mediastinum/Nodes: No enlarged mediastinal, hilar, or axillary lymph
nodes. Thyroid gland, trachea, and esophagus demonstrate no
significant findings.

Lungs/Pleura: There is no evidence of airspace disease,
consolidation, nodule, mass, pleural effusion or pneumothorax. Mild
dependent atelectasis identified.

Upper Abdomen: A 1.7 cm exophytic lesion extending off of the UPPER
LEFT kidney measures 26 Hounsfield units and is indeterminate.

A larger partially imaged lesion within the mid LEFT kidney has
Hounsfield units consistent with a cyst.

Cholelithiasis identified without CT evidence of acute
cholecystitis.

A moderate to large hiatal hernia is noted.

Musculoskeletal: No acute or suspicious bony abnormalities are
identified. Thoracic spine DISH changes are noted.

Review of the MIP images confirms the above findings.
IMPRESSION: 1. No acute abnormality. No evidence of pulmonary emboli or thoracic
aortic aneurysm.
2. Indeterminate 1.7 cm exophytic lesion extending off of the UPPER
LEFT kidney. If no prior outside cross-sectional studies are
available for comparison, recommend dedicated elective MRI (or CT)
with and without contrast
3. Cholelithiasis without CT evidence of acute cholecystitis.
4. Moderate to large hiatal hernia.
5. Coronary artery disease.
6. Aortic Atherosclerosis ([1S]-[1S]).

## 2019-10-26 MED ORDER — IOHEXOL 350 MG/ML SOLN
75.0000 mL | Freq: Once | INTRAVENOUS | Status: AC | PRN
  Administered 2019-10-26: 75 mL via INTRAVENOUS

## 2019-11-05 ENCOUNTER — Other Ambulatory Visit: Payer: Self-pay | Admitting: Family Medicine

## 2019-11-05 DIAGNOSIS — N289 Disorder of kidney and ureter, unspecified: Secondary | ICD-10-CM

## 2019-11-05 DIAGNOSIS — N2889 Other specified disorders of kidney and ureter: Secondary | ICD-10-CM

## 2019-11-12 DIAGNOSIS — I480 Paroxysmal atrial fibrillation: Secondary | ICD-10-CM | POA: Insufficient documentation

## 2019-11-21 ENCOUNTER — Ambulatory Visit
Admission: RE | Admit: 2019-11-21 | Discharge: 2019-11-21 | Disposition: A | Discharge status: Home / Self Care | Payer: Medicare Other | Source: Ambulatory Visit | Attending: Family Medicine | Admitting: Family Medicine

## 2019-11-21 ENCOUNTER — Other Ambulatory Visit: Payer: Self-pay

## 2019-11-21 DIAGNOSIS — N2889 Other specified disorders of kidney and ureter: Secondary | ICD-10-CM

## 2019-11-21 DIAGNOSIS — N289 Disorder of kidney and ureter, unspecified: Secondary | ICD-10-CM | POA: Diagnosis present

## 2019-11-21 IMAGING — MR MR ABDOMEN WO/W CM
17 series · 48 of 48 positions shown · IV contrast (8ml Gadavist)
Comparison: CT chest angiogram, [DATE]

CLINICAL DATA: Characterize superior pole renal lesion seen on
prior CT chest

EXAM:
MRI ABDOMEN WITHOUT AND WITH CONTRAST
TECHNIQUE: Multiplanar multisequence MR imaging of the abdomen was performed
both before and after the administration of intravenous contrast.
CONTRAST:  8mL GADAVIST GADOBUTROL 1 MMOL/ML IV SOLN

[Series 3: cor haste · coronal · 6.0mm · 1.19mm/px · 3 of 36 slices shown]
[im 1/36]
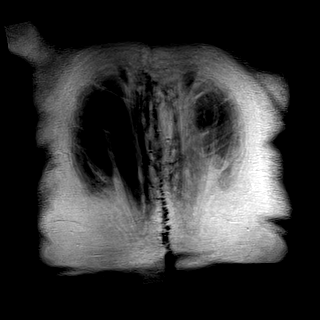
[im 18/36]
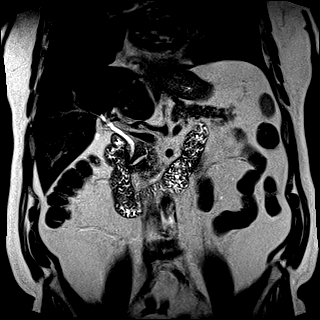
[im 36/36]
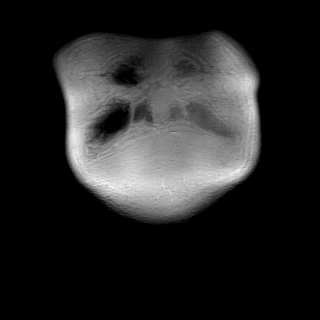

[Series 5: T2 fat-sat · axial · 6.0mm · 1.19mm/px · z∈[-99,+124]mm · 2 of 32 slices shown]
[im 1/32]
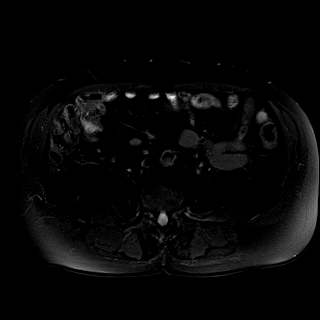
[im 32/32]
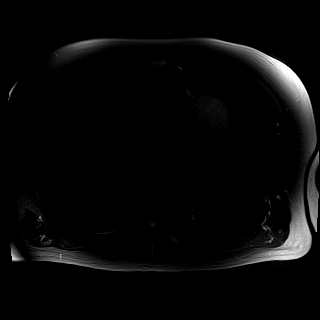

[Series 7: DWI · axial · 6.0mm · 1.42mm/px · z∈[-99,+124]mm · 4 of 96 slices shown (1 of 2)]
[im 1/96]
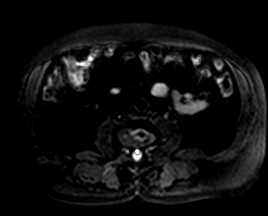
[im 32/96]
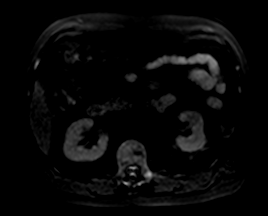
[im 64/96]
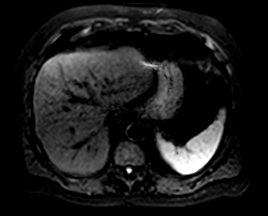
[im 96/96]
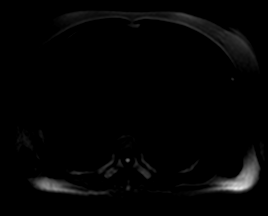

[Series 8: DWI · axial · 6.0mm · 1.42mm/px · 1 of 32 slices shown (2 of 2)]
[im 1/32]
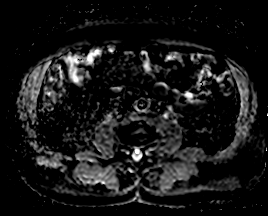

[Series 9: ax in & · axial · 3.0mm · 1.19mm/px · z∈[-94,+119]mm · 6 of 144 slices shown]
[im 1/144]
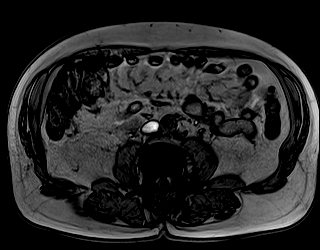
[im 29/144]
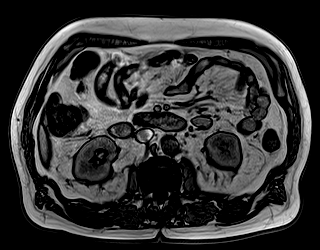
[im 58/144]
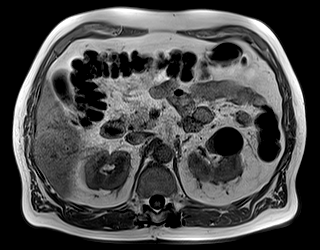
[im 86/144]
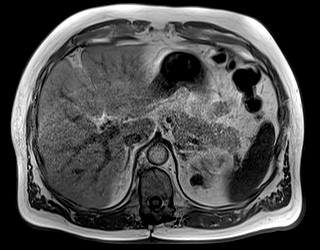
[im 115/144]
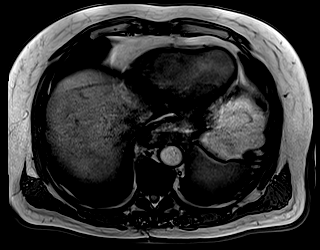
[im 144/144]
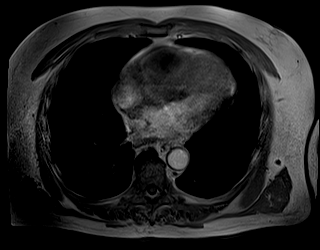

[Series 10: bSSFP · axial · 6.0mm · 0.74mm/px · 1 of 32 slices shown]
[im 1/32]
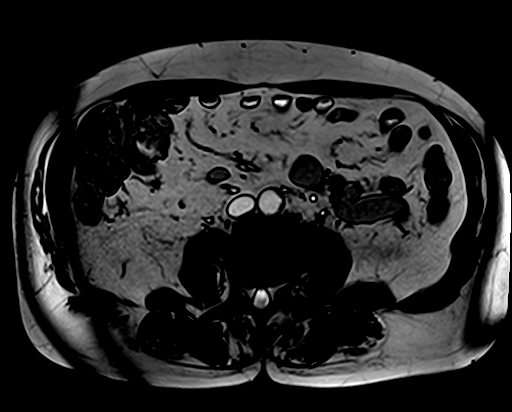

[Series 11: T1 dynamic · axial · non-contrast · 3.0mm · 1.19mm/px · z∈[-94,+119]mm · 3 of 72 slices shown (1 of 9)]
[im 1/72]
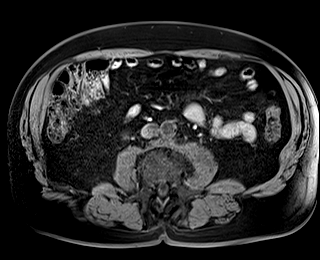
[im 36/72]
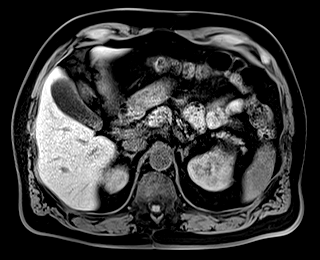
[im 72/72]
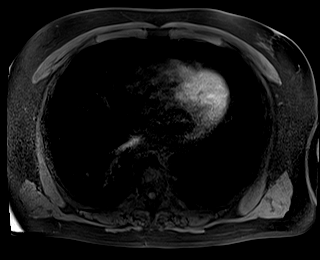

[Series 12: T1 dynamic · axial · 3.0mm · 1.19mm/px · z∈[-94,+119]mm · 3 of 72 slices shown (2 of 9)]
[im 1/72]
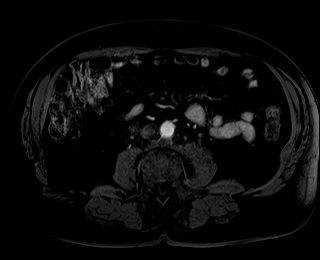
[im 36/72]
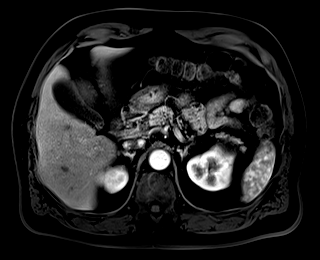
[im 72/72]
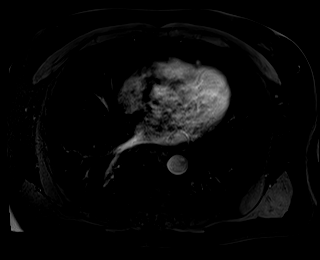

[Series 13: T1 dynamic · axial · 3.0mm · 1.19mm/px · z∈[-94,+119]mm · 3 of 72 slices shown (3 of 9)]
[im 1/72]
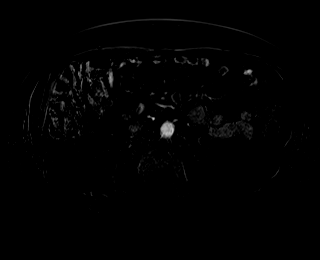
[im 36/72]
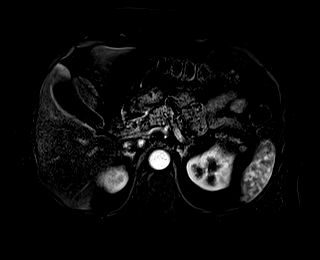
[im 72/72]
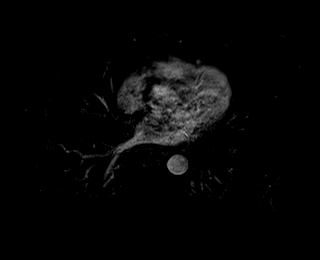

[Series 14: T1 dynamic · axial · 3.0mm · 1.19mm/px · z∈[-94,+119]mm · 3 of 72 slices shown (4 of 9)]
[im 1/72]
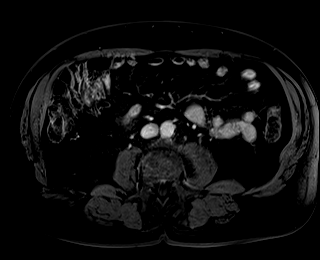
[im 36/72]
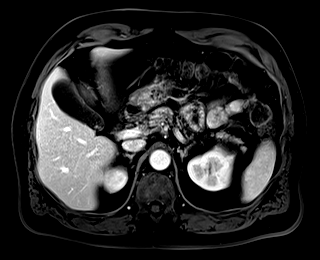
[im 72/72]
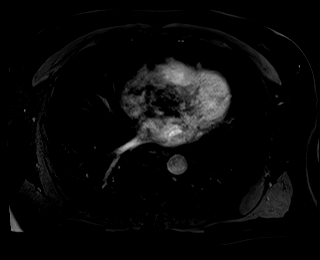

[Series 15: T1 dynamic · axial · 3.0mm · 1.19mm/px · z∈[-94,+119]mm · 3 of 72 slices shown (5 of 9)]
[im 1/72]
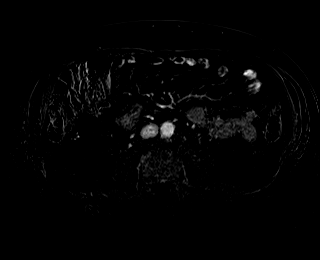
[im 36/72]
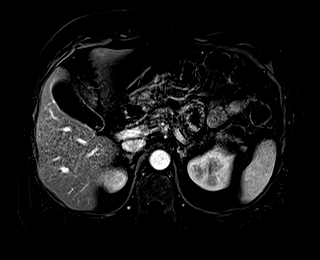
[im 72/72]
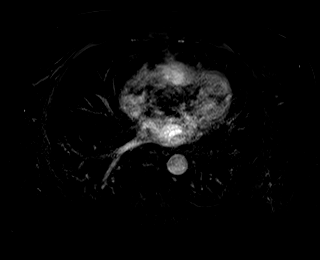

[Series 16: T1 dynamic · axial · 3.0mm · 1.19mm/px · z∈[-94,+119]mm · 3 of 72 slices shown (6 of 9)]
[im 1/72]
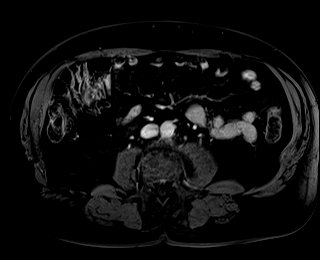
[im 36/72]
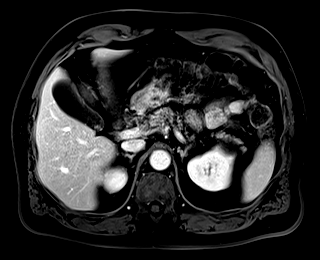
[im 72/72]
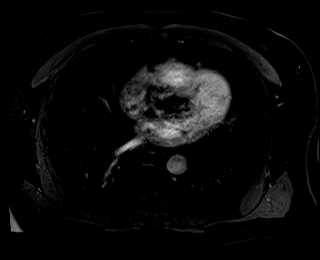

[Series 17: T1 dynamic · axial · 3.0mm · 1.19mm/px · z∈[-94,+119]mm · 3 of 72 slices shown (7 of 9)]
[im 1/72]
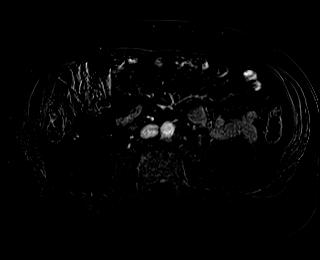
[im 36/72]
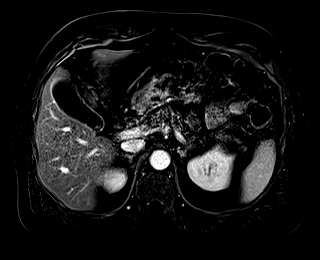
[im 72/72]
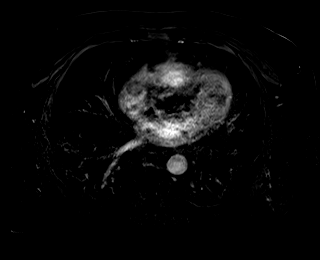

[Series 18: T1 dynamic post-contrast · coronal · 3.0mm · 1.31mm/px · 3 of 80 slices shown]
[im 1/80]
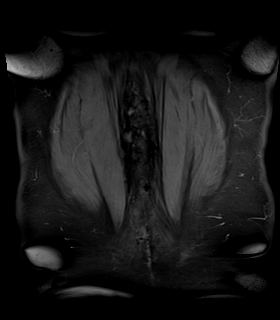
[im 40/80]
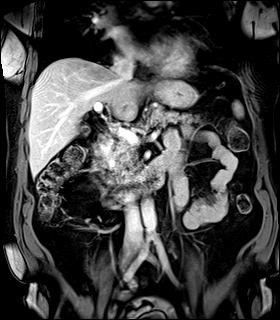
[im 80/80]
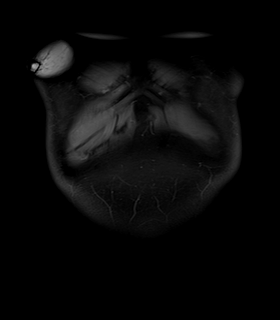

[Series 19: T2 · axial · 6.0mm · 1.19mm/px · 1 of 32 slices shown]
[im 1/32]
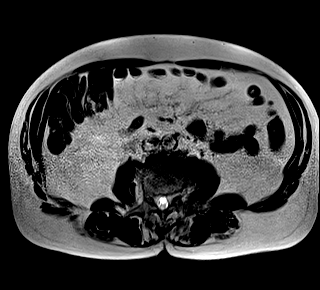

[Series 20: T1 dynamic · axial · 3.0mm · 1.19mm/px · z∈[-94,+119]mm · 3 of 72 slices shown (8 of 9)]
[im 1/72]
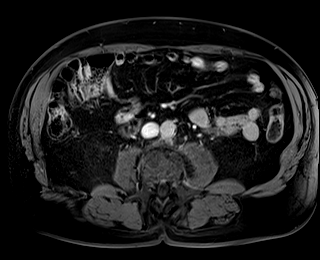
[im 36/72]
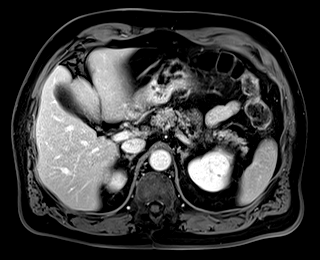
[im 72/72]
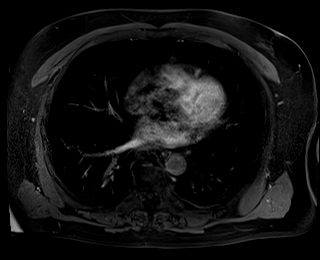

[Series 21: T1 dynamic · axial · 3.0mm · 1.19mm/px · z∈[-94,+119]mm · 3 of 72 slices shown (9 of 9)]
[im 1/72]
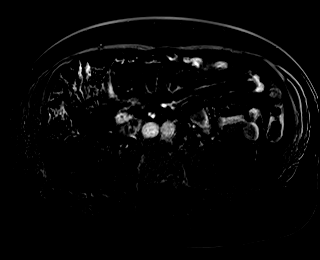
[im 36/72]
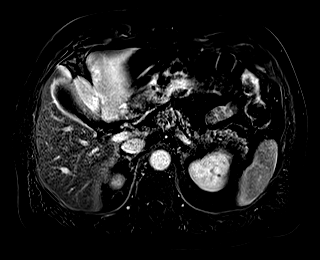
[im 72/72]
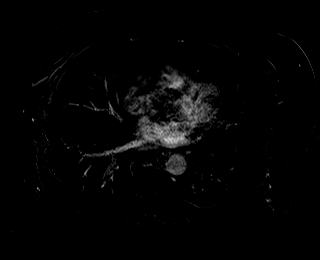

[48 of 48 positions shown; findings below may reference images not displayed]

FINDINGS: Lower chest: No acute findings.

Hepatobiliary: No mass or other parenchymal abnormality identified.

Pancreas: No mass, inflammatory changes, or other parenchymal
abnormality identified.

Spleen:  Within normal limits in size and appearance.

Adrenals/Urinary Tract: No masses identified. There is a 1.6 cm
exophytic cyst of the superior pole of the left kidney with minimal
layering T1 hyperintense debris. Additional 3.5 cm exophytic cyst of
the anterior midportion of the left kidney. No evidence of mass or
contrast enhancement. No evidence of hydronephrosis.

Stomach/Bowel: Visualized portions within the abdomen are
unremarkable.

Vascular/Lymphatic: No pathologically enlarged lymph nodes
identified. No abdominal aortic aneurysm demonstrated.

Other:  None.

Musculoskeletal: No suspicious bone lesions identified.
IMPRESSION: There is a 1.6 cm exophytic cyst of the superior pole of the left
kidney with minimal layering T1 hyperintense debris, consistent with
hemorrhagic or proteinaceous contents. Additional 3.5 cm exophytic
cyst of the anterior midportion of the left kidney. No further
follow-up is required for these benign cysts. No evidence of solid
mass or suspicious contrast enhancement.

## 2019-11-21 MED ORDER — GADOBUTROL 1 MMOL/ML IV SOLN
8.0000 mL | Freq: Once | INTRAVENOUS | Status: AC | PRN
  Administered 2019-11-21: 8 mL via INTRAVENOUS

## 2020-03-03 ENCOUNTER — Other Ambulatory Visit: Payer: Self-pay

## 2020-03-03 ENCOUNTER — Other Ambulatory Visit
Admission: RE | Admit: 2020-03-03 | Discharge: 2020-03-03 | Disposition: A | Discharge status: Home / Self Care | Payer: Medicare Other | Source: Ambulatory Visit | Attending: Internal Medicine | Admitting: Internal Medicine

## 2020-03-03 DIAGNOSIS — Z20822 Contact with and (suspected) exposure to covid-19: Secondary | ICD-10-CM | POA: Insufficient documentation

## 2020-03-03 DIAGNOSIS — Z01812 Encounter for preprocedural laboratory examination: Secondary | ICD-10-CM | POA: Insufficient documentation

## 2020-03-03 LAB — SARS CORONAVIRUS 2 (TAT 6-24 HRS): SARS Coronavirus 2: NEGATIVE

## 2020-03-04 ENCOUNTER — Encounter: Payer: Self-pay | Admitting: Internal Medicine

## 2020-03-05 ENCOUNTER — Ambulatory Visit: Payer: Medicare Other | Admitting: Anesthesiology

## 2020-03-05 ENCOUNTER — Encounter: Payer: Self-pay | Admitting: Internal Medicine

## 2020-03-05 ENCOUNTER — Encounter
Admission: RE | Disposition: A | Discharge status: Home / Self Care | Payer: Self-pay | Source: Home / Self Care | Attending: Internal Medicine

## 2020-03-05 ENCOUNTER — Ambulatory Visit
Admission: RE | Admit: 2020-03-05 | Discharge: 2020-03-05 | Disposition: A | Discharge status: Home / Self Care | Payer: Medicare Other | Attending: Internal Medicine | Admitting: Internal Medicine

## 2020-03-05 ENCOUNTER — Other Ambulatory Visit: Payer: Self-pay

## 2020-03-05 DIAGNOSIS — K219 Gastro-esophageal reflux disease without esophagitis: Secondary | ICD-10-CM | POA: Diagnosis not present

## 2020-03-05 DIAGNOSIS — R195 Other fecal abnormalities: Secondary | ICD-10-CM | POA: Diagnosis present

## 2020-03-05 DIAGNOSIS — I422 Other hypertrophic cardiomyopathy: Secondary | ICD-10-CM | POA: Diagnosis not present

## 2020-03-05 DIAGNOSIS — K640 First degree hemorrhoids: Secondary | ICD-10-CM | POA: Insufficient documentation

## 2020-03-05 DIAGNOSIS — Z79899 Other long term (current) drug therapy: Secondary | ICD-10-CM | POA: Insufficient documentation

## 2020-03-05 HISTORY — PX: COLONOSCOPY WITH PROPOFOL: SHX5780

## 2020-03-05 SURGERY — COLONOSCOPY WITH PROPOFOL
Anesthesia: General

## 2020-03-05 MED ORDER — SODIUM CHLORIDE 0.9 % IV SOLN
INTRAVENOUS | Status: DC
  Administered 2020-03-05: 20 mL/h via INTRAVENOUS

## 2020-03-05 MED ORDER — PHENYLEPHRINE HCL (PRESSORS) 10 MG/ML IV SOLN
INTRAVENOUS | Status: DC | PRN
  Administered 2020-03-05: 100 ug via INTRAVENOUS

## 2020-03-05 MED ORDER — PROPOFOL 10 MG/ML IV BOLUS
INTRAVENOUS | Status: DC | PRN
  Administered 2020-03-05: 30 mg via INTRAVENOUS
  Administered 2020-03-05: 100 mg via INTRAVENOUS

## 2020-03-05 MED ORDER — PROPOFOL 500 MG/50ML IV EMUL
INTRAVENOUS | Status: DC | PRN
  Administered 2020-03-05: 150 ug/kg/min via INTRAVENOUS

## 2020-03-05 NOTE — Anesthesia Postprocedure Evaluation (Signed)
Anesthesia Post Note  Patient: Zachary Clements  Procedure(s) Performed: COLONOSCOPY WITH PROPOFOL (N/A )  Patient location during evaluation: Endoscopy Anesthesia Type: General Level of consciousness: awake and alert Pain management: pain level controlled Vital Signs Assessment: post-procedure vital signs reviewed and stable Respiratory status: spontaneous breathing, nonlabored ventilation, respiratory function stable and patient connected to nasal cannula oxygen Cardiovascular status: blood pressure returned to baseline and stable Postop Assessment: no apparent nausea or vomiting Anesthetic complications: no   No complications documented.   Last Vitals:  Vitals:   03/05/20 1322 03/05/20 1332  BP: 120/76 124/76  Pulse: 71 77  Resp: 12 20  Temp:    SpO2: 98% 99%    Last Pain:  Vitals:   03/05/20 1332  TempSrc:   PainSc: 0-No pain                 Precious Haws Piscitello

## 2020-03-05 NOTE — H&P (Signed)
Outpatient short stay form Pre-procedure 03/05/2020 12:22 PM Zachary Clements K. Alice Reichert, M.D.  Primary Physician: Derinda Late, M.D.  Reason for visit:  Positive fecal dna test (Cologuard).  History of present illness:  Patient is a pleasant 65 y/o male presenting on referral from primary care provider for a POSITIVE Cologuard result. Patient denies change in bowel habits, rectal bleeding, weight loss or abdominal pain.      Current Facility-Administered Medications:  .  0.9 %  sodium chloride infusion, , Intravenous, Continuous, [place], Benay Pike, MD, Last Rate: 20 mL/hr at 03/05/20 1031, 20 mL/hr at 03/05/20 1031  Medications Prior to Admission  Medication Sig Dispense Refill Last Dose  . atorvastatin (LIPITOR) 10 MG tablet Take 10 mg by mouth daily with supper.   03/04/2020 at 0930  . DULoxetine (CYMBALTA) 60 MG capsule Take 120 mg by mouth daily.   03/04/2020 at Unknown time  . gabapentin (NEURONTIN) 300 MG capsule Take 1 capsule by mouth in the morning, at noon, and at bedtime.   03/04/2020 at Unknown time  . Multiple Vitamin (MULTIVITAMIN WITH MINERALS) TABS tablet Take 1 tablet by mouth daily.   Past Week at Unknown time  . timolol (TIMOPTIC) 0.5 % ophthalmic solution Place 1 drop into both eyes daily.    03/04/2020 at Unknown time  . traZODone (DESYREL) 50 MG tablet Take 1 tablet by mouth at bedtime.   03/04/2020 at Unknown time     No Known Allergies   Past Medical History:  Diagnosis Date  . Arthritis   . Dysrhythmia   . GERD (gastroesophageal reflux disease)   . Hypertension   . Hypertrophic cardiomyopathy ([place])   . Insomnia     Review of systems:  Otherwise negative.    Physical Exam  Gen: Alert, oriented. Appears stated age.  HEENT: [place]/AT. PERRLA. Lungs: CTA, no wheezes. CV: RR nl S1, S2. Abd: soft, benign, no masses. BS+ Ext: No edema. Pulses 2+    Planned procedures: Proceed with colonoscopy. The patient understands the nature of the planned procedure,  indications, risks, alternatives and potential complications including but not limited to bleeding, infection, perforation, damage to internal organs and possible oversedation/side effects from anesthesia. The patient agrees and gives consent to proceed.  Please refer to procedure notes for findings, recommendations and patient disposition/instructions.     Kellee Sittner K. Alice Reichert, M.D. Gastroenterology 03/05/2020  12:22 PM

## 2020-03-05 NOTE — Anesthesia Procedure Notes (Signed)
Date/Time: 03/05/2020 12:35 PM Performed by: Doreen Salvage, CRNA Pre-anesthesia Checklist: Patient identified, Emergency Drugs available, Suction available and Patient being monitored Patient Re-evaluated:Patient Re-evaluated prior to induction Oxygen Delivery Method: Nasal cannula Induction Type: IV induction Dental Injury: Teeth and Oropharynx as per pre-operative assessment  Comments: Nasal cannula with etCO2 monitoring

## 2020-03-05 NOTE — Op Note (Signed)
[facility] Gastroenterology Patient Name: Zachary Clements Procedure Date: 03/05/2020 12:34 PM MRN: [id number] Account #: [id number] Date of Birth: [birth year] Admit Type: Outpatient Age: 65 Room: [facility] ENDO ROOM 2 Gender: Male Note Status: Finalized Procedure:             Colonoscopy Indications:           Positive Cologuard test Providers:             Benay Pike. Alice Reichert MD, MD Referring MD:          Caprice Renshaw MD (Referring MD) Medicines:             Propofol per Anesthesia Complications:         No immediate complications. Procedure:             Pre-Anesthesia Assessment:                        - The risks and benefits of the procedure and the                         sedation options and risks were discussed with the                         patient. All questions were answered and informed                         consent was obtained.                        - Patient identification and proposed procedure were                         verified prior to the procedure by the nurse. The                         procedure was verified in the procedure room.                        - ASA Grade Assessment: III - A patient with severe                         systemic disease.                        - After reviewing the risks and benefits, the patient                         was deemed in satisfactory condition to undergo the                         procedure.                        After obtaining informed consent, the colonoscope was                         passed under direct vision. Throughout the procedure,                         the patient's blood pressure, pulse, and  oxygen                         saturations were monitored continuously. The                         Colonoscope was introduced through the anus and                         advanced to the the cecum, identified by appendiceal                         orifice and ileocecal valve. The colonoscopy  was                         performed without difficulty. The patient tolerated                         the procedure well. The quality of the bowel                         preparation was adequate. The ileocecal valve,                         appendiceal orifice, and rectum were photographed. Findings:      The perianal and digital rectal examinations were normal. Pertinent       negatives include normal sphincter tone and no palpable rectal lesions.      Non-bleeding internal hemorrhoids were found during retroflexion. The       hemorrhoids were Grade I (internal hemorrhoids that do not prolapse).      The colon (entire examined portion) appeared normal. Impression:            - Non-bleeding internal hemorrhoids.                        - The entire examined colon is normal.                        - No specimens collected. Recommendation:        - Patient has a contact number available for                         emergencies. The signs and symptoms of potential                         delayed complications were discussed with the patient.                         Return to normal activities tomorrow. Written                         discharge instructions were provided to the patient.                        - Resume previous diet.                        - Continue present medications.                        -  Repeat colonoscopy in 5 years for surveillance.                        - Patient has personal history of colon polyps and a                         family history of colon cancer in his brother.                         COLOGUARD should be CONTRAINDICATED screening test on                         this individual based on exclusion criteria. Procedure Code(s):     --- Professional ---                        [phone], Colonoscopy, flexible; diagnostic, including                         collection of specimen(s) by brushing or washing, when                         performed (separate  procedure) Diagnosis Code(s):     --- Professional ---                        R19.5, Other fecal abnormalities                        K64.0, First degree hemorrhoids CPT copyright 2019 American Medical Association. All rights reserved. The codes documented in this report are preliminary and upon coder review may  be revised to meet current compliance requirements. Efrain Sella MD, MD 03/05/2020 12:58:15 PM This report has been signed electronically. Number of Addenda: 0 Note Initiated On: 03/05/2020 12:34 PM Scope Withdrawal Time: 0 hours 6 minutes 42 seconds  Total Procedure Duration: 0 hours 12 minutes 32 seconds  Estimated Blood Loss:  Estimated blood loss: none.      [facility]

## 2020-03-05 NOTE — Transfer of Care (Signed)
Immediate Anesthesia Transfer of Care Note  Patient: Zachary Clements  Procedure(s) Performed: Procedure(s): COLONOSCOPY WITH PROPOFOL (N/A)  Patient Location: PACU and Endoscopy Unit  Anesthesia Type:General  Level of Consciousness: sedated  Airway & Oxygen Therapy: Patient Spontanous Breathing and Patient connected to nasal cannula oxygen  Post-op Assessment: Report given to RN and Post -op Vital signs reviewed and stable  Post vital signs: Reviewed and stable  Last Vitals:  Vitals:   03/05/20 1300 03/05/20 1302  BP: (!) 88/65 101/61  Pulse: 88 84  Resp: 19 16  Temp: 36.8 C   SpO2: 62% 22%    Complications: No apparent anesthesia complications

## 2020-03-05 NOTE — Anesthesia Preprocedure Evaluation (Signed)
Anesthesia Evaluation  Patient identified by MRN, date of birth, ID band Patient awake    Reviewed: Allergy & Precautions, H&P , NPO status , Patient's Chart, lab work & pertinent test results  History of Anesthesia Complications Negative for: history of anesthetic complications  Airway Mallampati: III  TM Distance: <3 FB Neck ROM: full    Dental  (+) Chipped, Poor Dentition, Missing   Pulmonary neg shortness of breath, former smoker,    Pulmonary exam normal        Cardiovascular Exercise Tolerance: Good hypertension, (-) angina(-) Past MI negative cardio ROS Normal cardiovascular exam+ dysrhythmias Atrial Fibrillation      Neuro/Psych  Neuromuscular disease negative psych ROS   GI/Hepatic Neg liver ROS, GERD  Medicated and Controlled,  Endo/Other  negative endocrine ROS  Renal/GU negative Renal ROS  negative genitourinary   Musculoskeletal  (+) Arthritis ,   Abdominal   Peds  Hematology negative hematology ROS (+)   Anesthesia Other Findings Past Medical History: No date: Arthritis No date: Dysrhythmia No date: GERD (gastroesophageal reflux disease) No date: Hypertension No date: Hypertrophic cardiomyopathy (H[facility]) No date: Insomnia  Past Surgical History: No date: BACK SURGERY     Comment:  rods in back No date: BACK SURGERY     Comment:  removal of rods in back No date: colonscopy No date: KNEE ARTHROSCOPY; Right 10/19/2019: LEFT HEART CATH AND CORONARY ANGIOGRAPHY; N/A     Comment:  Procedure: LEFT HEART CATH AND CORONARY ANGIOGRAPHY;                Surgeon: Nelva Bush, MD;  Location: [place]               CV LAB;  Service: Cardiovascular;  Laterality: N/A; 11/09/2017: LUMBAR LAMINECTOMY/DECOMPRESSION MICRODISCECTOMY; N/A     Comment:  Procedure: LUMBAR LAMINECTOMY/DECOMPRESSION               MICRODISCECTOMY 1 LEVEL-4-5;  Surgeon: Meade Maw, MD;  Location: [facility] ORS;   Service: Neurosurgery;              Laterality: N/A; 10/19/2019: TEMPORARY PACEMAKER; N/A     Comment:  Procedure: TEMPORARY PACEMAKER;  Surgeon: Nelva Bush, MD;  Location: [place] CV LAB;                Service: Cardiovascular;  Laterality: N/A;  BMI    Body Mass Index: 28.41 kg/m      Reproductive/Obstetrics negative OB ROS                             Anesthesia Physical Anesthesia Plan  ASA: III  Anesthesia Plan: General   Post-op Pain Management:    Induction: Intravenous  PONV Risk Score and Plan: Propofol infusion and TIVA  Airway Management Planned: Natural Airway and Nasal Cannula  Additional Equipment:   Intra-op Plan:   Post-operative Plan:   Informed Consent: I have reviewed the patients History and Physical, chart, labs and discussed the procedure including the risks, benefits and alternatives for the proposed anesthesia with the patient or authorized representative who has indicated his/her understanding and acceptance.     Dental Advisory Given  Plan Discussed with: Anesthesiologist, CRNA and Surgeon  Anesthesia Plan Comments: (Patient consented for risks of anesthesia including but not limited to:  -  adverse reactions to medications - risk of airway placement if required - damage to eyes, teeth, lips or other oral mucosa - nerve damage due to positioning  - sore throat or hoarseness - Damage to heart, brain, nerves, lungs, other parts of body or loss of life  Patient voiced understanding.)        Anesthesia Quick Evaluation

## 2020-03-06 ENCOUNTER — Encounter: Payer: Self-pay | Admitting: Internal Medicine

## 2020-06-09 ENCOUNTER — Inpatient Hospital Stay: Payer: Medicare Other

## 2020-06-09 ENCOUNTER — Inpatient Hospital Stay: Payer: Medicare Other | Attending: Oncology | Admitting: Oncology

## 2020-06-09 DIAGNOSIS — R5381 Other malaise: Secondary | ICD-10-CM | POA: Insufficient documentation

## 2020-06-09 DIAGNOSIS — D509 Iron deficiency anemia, unspecified: Secondary | ICD-10-CM | POA: Diagnosis not present

## 2020-06-09 DIAGNOSIS — E785 Hyperlipidemia, unspecified: Secondary | ICD-10-CM | POA: Diagnosis not present

## 2020-06-09 DIAGNOSIS — R778 Other specified abnormalities of plasma proteins: Secondary | ICD-10-CM | POA: Diagnosis not present

## 2020-06-09 DIAGNOSIS — K219 Gastro-esophageal reflux disease without esophagitis: Secondary | ICD-10-CM | POA: Diagnosis not present

## 2020-06-09 DIAGNOSIS — D472 Monoclonal gammopathy: Secondary | ICD-10-CM

## 2020-06-09 DIAGNOSIS — I429 Cardiomyopathy, unspecified: Secondary | ICD-10-CM | POA: Diagnosis not present

## 2020-06-09 DIAGNOSIS — Z7952 Long term (current) use of systemic steroids: Secondary | ICD-10-CM | POA: Insufficient documentation

## 2020-06-09 DIAGNOSIS — Z87891 Personal history of nicotine dependence: Secondary | ICD-10-CM | POA: Diagnosis not present

## 2020-06-09 DIAGNOSIS — I10 Essential (primary) hypertension: Secondary | ICD-10-CM | POA: Insufficient documentation

## 2020-06-09 DIAGNOSIS — M1990 Unspecified osteoarthritis, unspecified site: Secondary | ICD-10-CM | POA: Insufficient documentation

## 2020-06-09 DIAGNOSIS — Z79899 Other long term (current) drug therapy: Secondary | ICD-10-CM | POA: Diagnosis not present

## 2020-06-09 DIAGNOSIS — R5383 Other fatigue: Secondary | ICD-10-CM | POA: Insufficient documentation

## 2020-06-09 LAB — IRON AND TIBC
Iron: 70 ug/dL (ref 45–182)
Saturation Ratios: 15 % — ABNORMAL LOW (ref 17.9–39.5)
TIBC: 459 ug/dL — ABNORMAL HIGH (ref 250–450)
UIBC: 389 ug/dL

## 2020-06-09 LAB — CBC WITH DIFFERENTIAL/PLATELET
Abs Immature Granulocytes: 0.13 10*3/uL — ABNORMAL HIGH (ref 0.00–0.07)
Basophils Absolute: 0.1 10*3/uL (ref 0.0–0.1)
Basophils Relative: 1 %
Eosinophils Absolute: 0.4 10*3/uL (ref 0.0–0.5)
Eosinophils Relative: 3 %
HCT: 42.6 % (ref 39.0–52.0)
Hemoglobin: 13.6 g/dL (ref 13.0–17.0)
Immature Granulocytes: 1 %
Lymphocytes Relative: 19 %
Lymphs Abs: 2.6 10*3/uL (ref 0.7–4.0)
MCH: 28 pg (ref 26.0–34.0)
MCHC: 31.9 g/dL (ref 30.0–36.0)
MCV: 87.8 fL (ref 80.0–100.0)
Monocytes Absolute: 1.1 10*3/uL — ABNORMAL HIGH (ref 0.1–1.0)
Monocytes Relative: 8 %
Neutro Abs: 9.5 10*3/uL — ABNORMAL HIGH (ref 1.7–7.7)
Neutrophils Relative %: 68 %
Platelets: 387 10*3/uL (ref 150–400)
RBC: 4.85 MIL/uL (ref 4.22–5.81)
RDW: 15.6 % — ABNORMAL HIGH (ref 11.5–15.5)
WBC: 13.8 10*3/uL — ABNORMAL HIGH (ref 4.0–10.5)
nRBC: 0 % (ref 0.0–0.2)

## 2020-06-09 LAB — COMPREHENSIVE METABOLIC PANEL
ALT: 19 U/L (ref 0–44)
AST: 18 U/L (ref 15–41)
Albumin: 4.3 g/dL (ref 3.5–5.0)
Alkaline Phosphatase: 51 U/L (ref 38–126)
Anion gap: 9 (ref 5–15)
BUN: 16 mg/dL (ref 8–23)
CO2: 26 mmol/L (ref 22–32)
Calcium: 9.4 mg/dL (ref 8.9–10.3)
Chloride: 100 mmol/L (ref 98–111)
Creatinine, Ser: 1.24 mg/dL (ref 0.61–1.24)
GFR, Estimated: >60 mL/min (ref >60)
Glucose, Bld: 81 mg/dL (ref 70–99)
Potassium: 3.9 mmol/L (ref 3.5–5.1)
Sodium: 135 mmol/L (ref 135–145)
Total Bilirubin: 0.8 mg/dL (ref 0.3–1.2)
Total Protein: 7.2 g/dL (ref 6.5–8.1)

## 2020-06-09 LAB — RETICULOCYTES
Immature Retic Fract: 18.9 % — ABNORMAL HIGH (ref 2.3–15.9)
RBC.: 4.87 MIL/uL (ref 4.22–5.81)
Retic Count, Absolute: 94.5 10*3/uL (ref 19.0–186.0)
Retic Ct Pct: 1.9 % (ref 0.4–3.1)

## 2020-06-09 LAB — VITAMIN B12: Vitamin B-12: 835 pg/mL (ref 180–914)

## 2020-06-09 LAB — FERRITIN: Ferritin: 7 ng/mL — ABNORMAL LOW (ref 24–336)

## 2020-06-09 LAB — FOLATE: Folate: 60 ng/mL (ref >5.9)

## 2020-06-09 NOTE — Progress Notes (Signed)
Hematology/Oncology Consult note [facility] Telephone:([phone] [phone]  Patient Care Team: Derinda Late, MD as PCP - General (Family Medicine)   Name of the patient: Zachary Clements  [id number]  04-26-1955    Reason for referral-abnormal SPEP   Referring [place]  Date of visit: 06/09/20   History of presenting illness- Patient is a 65 year old male with a past medical history significant for hypertension hyperlipidemia cardiomyopathy and GERD who was seen by rheumatology for symptoms of inflammatory arthritis.  He was started on methotrexate weekly for the same.  As a part of the work-up he had multiple blood work checked including no CBC which showed a white count of 8.8, H&H of 12.2/39.1 with an MCV of 90 CMP was normal.  He also had SPEP checked which showed IgG lambda light chain specificity with an M protein of 0.2.  IgG level is normal at 1032.  Patient referred for further work-up  ECOG PS- 1  Pain scale- 0   Review of systems- Review of Systems  Constitutional: Positive for malaise/fatigue. Negative for chills, fever and weight loss.  HENT: Negative for congestion, ear discharge and nosebleeds.   Eyes: Negative for blurred vision.  Respiratory: Negative for cough, hemoptysis, sputum production, shortness of breath and wheezing.   Cardiovascular: Negative for chest pain, palpitations, orthopnea and claudication.  Gastrointestinal: Negative for abdominal pain, blood in stool, constipation, diarrhea, heartburn, melena, nausea and vomiting.  Genitourinary: Negative for dysuria, flank pain, frequency, hematuria and urgency.  Musculoskeletal: Negative for back pain, joint pain and myalgias.  Skin: Negative for rash.  Neurological: Negative for dizziness, tingling, focal weakness, seizures, weakness and headaches.  Endo/Heme/Allergies: Does not bruise/bleed easily.  Psychiatric/Behavioral: Negative for depression and  suicidal ideas. The patient does not have insomnia.     No Known Allergies  Patient Active Problem List   Diagnosis Date Noted  . Complete heart block ([place]) 10/19/2019  . CHB (complete heart block) ([place]) 10/19/2019  . Chest pain   . Other hyperlipidemia   . Idiopathic peripheral neuropathy      Past Medical History:  Diagnosis Date  . Arthritis   . Dysrhythmia   . GERD (gastroesophageal reflux disease)   . Hypertension   . Hypertrophic cardiomyopathy ([place])   . Insomnia      Past Surgical History:  Procedure Laterality Date  . BACK SURGERY     rods in back  . BACK SURGERY     removal of rods in back  . COLONOSCOPY WITH PROPOFOL N/A 03/05/2020   Procedure: COLONOSCOPY WITH PROPOFOL;  Surgeon: Toledo, Benay Pike, MD;  Location: [facility];  Service: Gastroenterology;  Laterality: N/A;  . colonscopy    . KNEE ARTHROSCOPY Right   . LEFT HEART CATH AND CORONARY ANGIOGRAPHY N/A 10/19/2019   Procedure: LEFT HEART CATH AND CORONARY ANGIOGRAPHY;  Surgeon: Nelva Bush, MD;  Location: [place] [facility];  Service: Cardiovascular;  Laterality: N/A;  . LUMBAR LAMINECTOMY/DECOMPRESSION MICRODISCECTOMY N/A 11/09/2017   Procedure: LUMBAR LAMINECTOMY/DECOMPRESSION MICRODISCECTOMY 1 LEVEL-4-5;  Surgeon: Meade Maw, MD;  Location: [facility] ORS;  Service: Neurosurgery;  Laterality: N/A;  . TEMPORARY PACEMAKER N/A 10/19/2019   Procedure: TEMPORARY PACEMAKER;  Surgeon: Nelva Bush, MD;  Location: [place] [facility];  Service: Cardiovascular;  Laterality: N/A;    Social History   Socioeconomic History  . Marital status: Married    Spouse name: Not on file  . Number of children: Not on file  . Years of education: Not on file  .  Highest education level: Not on file  Occupational History  . Not on file  Tobacco Use  . Smoking status: Former Smoker    Types: Cigarettes    Quit date: 11/04/1998    Years since quitting: 21.6  . Smokeless tobacco: Never Used  Vaping Use   . Vaping Use: Never used  Substance and Sexual Activity  . Alcohol use: Not Currently  . Drug use: Not on file    Comment: cbd oil  . Sexual activity: Not on file  Other Topics Concern  . Not on file  Social History Narrative  . Not on file   Social Determinants of Health   Financial Resource Strain: Not on file  Food Insecurity: Not on file  Transportation Needs: Not on file  Physical Activity: Not on file  Stress: Not on file  Social Connections: Not on file  Intimate Partner Violence: Not on file     No family history on file.   Current Outpatient Medications:  .  atorvastatin (LIPITOR) 10 MG tablet, Take 10 mg by mouth daily with supper., Disp: , Rfl:  .  DULoxetine (CYMBALTA) 60 MG capsule, Take 120 mg by mouth daily., Disp: , Rfl:  .  gabapentin (NEURONTIN) 300 MG capsule, Take 1 capsule by mouth in the morning, at noon, and at bedtime., Disp: , Rfl:  .  Multiple Vitamin (MULTIVITAMIN WITH MINERALS) TABS tablet, Take 1 tablet by mouth daily., Disp: , Rfl:  .  timolol (TIMOPTIC) 0.5 % ophthalmic solution, Place 1 drop into both eyes daily. , Disp: , Rfl:  .  traZODone (DESYREL) 50 MG tablet, Take 1 tablet by mouth at bedtime., Disp: , Rfl:    Physical exam:  Physical Exam Cardiovascular:     Rate and Rhythm: Normal rate and regular rhythm.     Heart sounds: Normal heart sounds.  Pulmonary:     Effort: Pulmonary effort is normal.     Breath sounds: Normal breath sounds.  Abdominal:     General: Bowel sounds are normal.     Palpations: Abdomen is soft.  Skin:    General: Skin is warm and dry.  Neurological:     Mental Status: He is alert and oriented to person, place, and time.        CMP Latest Ref Rng & Units 10/20/2019  Glucose 70 - 99 mg/dL 100(H)  BUN 8 - 23 mg/dL 26(H)  Creatinine 0.61 - 1.24 mg/dL 1.16  Sodium 135 - 145 mmol/L 138  Potassium 3.5 - 5.1 mmol/L 4.1  Chloride 98 - 111 mmol/L 106  CO2 22 - 32 mmol/L 26  Calcium 8.9 - 10.3 mg/dL  8.4(L)  Total Protein 6.5 - 8.1 g/dL -  Total Bilirubin 0.3 - 1.2 mg/dL -  Alkaline Phos 38 - 126 U/L -  AST 15 - 41 U/L -  ALT 0 - 44 U/L -   CBC Latest Ref Rng & Units 10/20/2019  WBC 4.0 - 10.5 K/uL 9.3  Hemoglobin 13.0 - 17.0 g/dL 11.7(L)  Hematocrit 39.0 - 52.0 % 35.1(L)  Platelets 150 - 400 K/uL 255     Assessment and plan- Patient is a 65 y.o. male referred for abnormal SPEP  Patient was found to have a small amount of 0.2 IgG lambda paraprotein on work-up that was done for inflammatory arthritis.  He has mild anemia but no evidence of kidney injury hypercalcemia.  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  Today I will check a  CBC with differential, CMP myeloma panel and serum free light chains.  I will also complete his anemia work-up including CBC ferritin and iron studies B12 folate. I will see him for in person or video visit next week.  Given that his IgG M protein is less than 1.5 g he does not require a bone marrow biopsy at this time   Thank you for this kind referral and the opportunity to participate in the care of this patient   Visit Diagnosis 1. MGUS (monoclonal gammopathy of unknown significance)     Dr. Randa Evens, MD, MPH [facility] [phone] 06/09/2020

## 2020-06-10 LAB — PROTEIN ELECTRO, RANDOM URINE
Albumin ELP, Urine: 49.1 %
Alpha-1-Globulin, U: 2.1 %
Alpha-2-Globulin, U: 11.8 %
Beta Globulin, U: 30 %
Gamma Globulin, U: 6.9 %
Total Protein, Urine: 309.1 mg/dL

## 2020-06-10 LAB — KAPPA/LAMBDA LIGHT CHAINS
Kappa free light chain: 15.8 mg/L (ref 3.3–19.4)
Kappa, lambda light chain ratio: 2.11 — ABNORMAL HIGH (ref 0.26–1.65)
Lambda free light chains: 7.5 mg/L (ref 5.7–26.3)

## 2020-06-11 ENCOUNTER — Encounter: Payer: Self-pay | Admitting: Oncology

## 2020-06-11 LAB — MULTIPLE MYELOMA PANEL, SERUM
Albumin SerPl Elph-Mcnc: 3.9 g/dL (ref 2.9–4.4)
Albumin/Glob SerPl: 1.4 (ref 0.7–1.7)
Alpha 1: 0.2 g/dL (ref 0.0–0.4)
Alpha2 Glob SerPl Elph-Mcnc: 0.7 g/dL (ref 0.4–1.0)
B-Globulin SerPl Elph-Mcnc: 1 g/dL (ref 0.7–1.3)
Gamma Glob SerPl Elph-Mcnc: 0.9 g/dL (ref 0.4–1.8)
Globulin, Total: 2.9 g/dL (ref 2.2–3.9)
IgA: 136 mg/dL (ref 61–437)
IgG (Immunoglobin G), Serum: 942 mg/dL (ref 603–1613)
IgM (Immunoglobulin M), Srm: 13 mg/dL — ABNORMAL LOW (ref 20–172)
M Protein SerPl Elph-Mcnc: 0.2 g/dL — ABNORMAL HIGH
Total Protein ELP: 6.8 g/dL (ref 6.0–8.5)

## 2020-06-18 DIAGNOSIS — D472 Monoclonal gammopathy: Secondary | ICD-10-CM | POA: Insufficient documentation

## 2020-06-18 DIAGNOSIS — Z79899 Other long term (current) drug therapy: Secondary | ICD-10-CM | POA: Insufficient documentation

## 2020-06-18 DIAGNOSIS — Z7960 Long term (current) use of unspecified immunomodulators and immunosuppressants: Secondary | ICD-10-CM | POA: Insufficient documentation

## 2020-06-18 DIAGNOSIS — M0609 Rheumatoid arthritis without rheumatoid factor, multiple sites: Secondary | ICD-10-CM | POA: Insufficient documentation

## 2020-06-19 ENCOUNTER — Inpatient Hospital Stay (HOSPITAL_BASED_OUTPATIENT_CLINIC_OR_DEPARTMENT_OTHER): Payer: Medicare Other | Admitting: Oncology

## 2020-06-19 DIAGNOSIS — D509 Iron deficiency anemia, unspecified: Secondary | ICD-10-CM

## 2020-06-19 DIAGNOSIS — D472 Monoclonal gammopathy: Secondary | ICD-10-CM

## 2020-06-19 NOTE — Progress Notes (Signed)
I connected with Zachary Clements on 06/19/20 at 11:15 AM EDT by video enabled telemedicine visit and verified that I am speaking with the correct person using two identifiers.   I discussed the limitations, risks, security and privacy concerns of performing an evaluation and management service by telemedicine and the availability of in-person appointments. I also discussed with the patient that there may be a patient responsible charge related to this service. The patient expressed understanding and agreed to proceed.  Other persons participating in the visit and their role in the encounter:  none  Patient's location:  home Provider's location:  work  Risk analyst Complaint: Discuss results of blood work  History of present illness: Patient is a 65 year old male with a past medical history significant for hypertension hyperlipidemia cardiomyopathy and GERD who was seen by rheumatology for symptoms of inflammatory arthritis.  He was started on methotrexate weekly for the same.  As a part of the work-up he had multiple blood work checked including no CBC which showed a white count of 8.8, H&H of 12.2/39.1 with an MCV of 90 CMP was normal.  He also had SPEP checked which showed IgG lambda light chain specificity with an M protein of 0.2.  IgG level is normal at 1032.  Patient referred for further work-up   Interval history patient denies any specific complaints at this time other than mild fatigue.  Denies any blood loss in her stool or urine.  Denies any dark melanotic stools   Review of Systems  Constitutional: Positive for malaise/fatigue. Negative for chills, fever and weight loss.  HENT: Negative for congestion, ear discharge and nosebleeds.   Eyes: Negative for blurred vision.  Respiratory: Negative for cough, hemoptysis, sputum production, shortness of breath and wheezing.   Cardiovascular: Negative for chest pain, palpitations, orthopnea and claudication.  Gastrointestinal: Negative for  abdominal pain, blood in stool, constipation, diarrhea, heartburn, melena, nausea and vomiting.  Genitourinary: Negative for dysuria, flank pain, frequency, hematuria and urgency.  Musculoskeletal: Negative for back pain, joint pain and myalgias.  Skin: Negative for rash.  Neurological: Negative for dizziness, tingling, focal weakness, seizures, weakness and headaches.  Endo/Heme/Allergies: Does not bruise/bleed easily.  Psychiatric/Behavioral: Negative for depression and suicidal ideas. The patient does not have insomnia.     No Known Allergies  Past Medical History:  Diagnosis Date  . Arthritis   . Dysrhythmia   . GERD (gastroesophageal reflux disease)   . Hypertension   . Hypertrophic cardiomyopathy ([place])   . Insomnia     Past Surgical History:  Procedure Laterality Date  . BACK SURGERY     rods in back  . BACK SURGERY     removal of rods in back  . COLONOSCOPY WITH PROPOFOL N/A 03/05/2020   Procedure: COLONOSCOPY WITH PROPOFOL;  Surgeon: Toledo, Benay Pike, MD;  Location: [facility];  Service: Gastroenterology;  Laterality: N/A;  . colonscopy    . KNEE ARTHROSCOPY Right   . LEFT HEART CATH AND CORONARY ANGIOGRAPHY N/A 10/19/2019   Procedure: LEFT HEART CATH AND CORONARY ANGIOGRAPHY;  Surgeon: Nelva Bush, MD;  Location: [place] [facility];  Service: Cardiovascular;  Laterality: N/A;  . LUMBAR LAMINECTOMY/DECOMPRESSION MICRODISCECTOMY N/A 11/09/2017   Procedure: LUMBAR LAMINECTOMY/DECOMPRESSION MICRODISCECTOMY 1 LEVEL-4-5;  Surgeon: Meade Maw, MD;  Location: [facility];  Service: Neurosurgery;  Laterality: N/A;  . TEMPORARY PACEMAKER N/A 10/19/2019   Procedure: TEMPORARY PACEMAKER;  Surgeon: Nelva Bush, MD;  Location: [place] [facility];  Service: Cardiovascular;  Laterality: N/A;    Social  History   Socioeconomic History  . Marital status: Married    Spouse name: Not on file  . Number of children: Not on file  . Years of education: Not on file  .  Highest education level: Not on file  Occupational History  . Not on file  Tobacco Use  . Smoking status: Former Smoker    Types: Cigarettes    Quit date: 11/04/1998    Years since quitting: 21.6  . Smokeless tobacco: Never Used  Vaping Use  . Vaping Use: Never used  Substance and Sexual Activity  . Alcohol use: Not Currently  . Drug use: Not on file    Comment: cbd oil  . Sexual activity: Not on file  Other Topics Concern  . Not on file  Social History Narrative  . Not on file   Social Determinants of Health   Financial Resource Strain: Not on file  Food Insecurity: Not on file  Transportation Needs: Not on file  Physical Activity: Not on file  Stress: Not on file  Social Connections: Not on file  Intimate Partner Violence: Not on file    No family history on file.   Current Outpatient Medications:  .  atorvastatin (LIPITOR) 10 MG tablet, Take 10 mg by mouth daily with supper., Disp: , Rfl:  .  DULoxetine (CYMBALTA) 60 MG capsule, Take 120 mg by mouth daily., Disp: , Rfl:  .  gabapentin (NEURONTIN) 300 MG capsule, Take 2 capsules by mouth in the morning, at noon, and at bedtime., Disp: , Rfl:  .  HYDROcodone-acetaminophen (NORCO) 10-325 MG tablet, Take 1 tablet by mouth 2 (two) times daily as needed., Disp: , Rfl:  .  metoprolol succinate (TOPROL-XL) 25 MG 24 hr tablet, TAKE 1/2 TABLET(12.5 MG) BY MOUTH EVERY DAY, Disp: , Rfl:  .  Multiple Vitamin (MULTIVITAMIN WITH MINERALS) TABS tablet, Take 1 tablet by mouth daily., Disp: , Rfl:  .  predniSONE (DELTASONE) 5 MG tablet, Take by mouth., Disp: , Rfl:  .  timolol (TIMOPTIC) 0.5 % ophthalmic solution, Place 1 drop into both eyes daily. , Disp: , Rfl:  .  traZODone (DESYREL) 50 MG tablet, Take 1 tablet by mouth at bedtime., Disp: , Rfl:   No results found.  No images are attached to the encounter.   CMP Latest Ref Rng & Units 06/09/2020  Glucose 70 - 99 mg/dL 81  BUN 8 - 23 mg/dL 16  Creatinine 0.61 - 1.24 mg/dL 1.24   Sodium 135 - 145 mmol/L 135  Potassium 3.5 - 5.1 mmol/L 3.9  Chloride 98 - 111 mmol/L 100  CO2 22 - 32 mmol/L 26  Calcium 8.9 - 10.3 mg/dL 9.4  Total Protein 6.5 - 8.1 g/dL 7.2  Total Bilirubin 0.3 - 1.2 mg/dL 0.8  Alkaline Phos 38 - 126 U/L 51  AST 15 - 41 U/L 18  ALT 0 - 44 U/L 19   CBC Latest Ref Rng & Units 06/09/2020  WBC 4.0 - 10.5 K/uL 13.8(H)  Hemoglobin 13.0 - 17.0 g/dL 13.6  Hematocrit 39.0 - 52.0 % 42.6  Platelets 150 - 400 K/uL 387     Observation/objective: Appears in no acute distress over video visit today.  Breathing is nonlabored  Assessment and plan: Patient is a 65 year old male referred for MGUS  MGUS: Discussed natural history of MGUS and the low risk to transform to mild dermal myeloma.  Based on the results of his blood work he does not have any evidence of anemia or renal  failure or hypercalcemia.  Serum free light chain ratio is only mildly elevated at 2.0 and M protein at 0.2.  This is consistent with IgG lambda MGUS which can be monitored conservatively without the need for a bone marrow biopsy.  We will repeat his CBC CMP myeloma panel and serum free light chains in 6 months.  Iron deficiency anemia: On patient's previous blood work he was noted to have a mild anemia with a hemoglobin between 11-12.  Has now improved to 13.6.  Iron studies are consistent with iron deficiency with a ferritin level of 7 and elevated TIBC.  Patient is willing to try oral iron every other day to see if his iron levels improved.  We will repeat CBC ferritin and iron studies in 3 months and if levels remain low will consider IV iron at that time.  With regards to etiology of iron deficiency-patient had a positive Cologuard test in January 2022 and underwent a colonoscopy which was unremarkable.  However given the positive Cologuard and ongoing iron deficiency it would be worthwhile to consider upper endoscopy and/or capsule endoscopy as well.  I have asked him to get in touch with [facility] for this and we will reach out to them as well.  Follow-up instructions: As above  I discussed the assessment and treatment plan with the patient. The patient was provided an opportunity to ask questions and all were answered. The patient agreed with the plan and demonstrated an understanding of the instructions.   The patient was advised to call back or seek an in-person evaluation if the symptoms worsen or if the condition fails to improve as anticipated.   Visit Diagnosis: 1. MGUS (monoclonal gammopathy of unknown significance)   2. Iron deficiency anemia, unspecified iron deficiency anemia type     Dr. Randa Evens, MD, MPH [place] [facility] Tel- [phone] 06/19/2020 1:40 PM

## 2020-07-30 ENCOUNTER — Other Ambulatory Visit: Payer: Self-pay | Admitting: Physical Medicine & Rehabilitation

## 2020-07-30 ENCOUNTER — Other Ambulatory Visit (HOSPITAL_COMMUNITY): Payer: Self-pay | Admitting: Physical Medicine & Rehabilitation

## 2020-07-30 DIAGNOSIS — G8929 Other chronic pain: Secondary | ICD-10-CM

## 2020-07-30 DIAGNOSIS — M5442 Lumbago with sciatica, left side: Secondary | ICD-10-CM

## 2020-08-07 ENCOUNTER — Ambulatory Visit
Admission: RE | Admit: 2020-08-07 | Discharge: 2020-08-07 | Disposition: A | Discharge status: Home / Self Care | Payer: Medicare Other | Source: Ambulatory Visit | Attending: Physical Medicine & Rehabilitation | Admitting: Physical Medicine & Rehabilitation

## 2020-08-07 ENCOUNTER — Other Ambulatory Visit: Payer: Self-pay

## 2020-08-07 DIAGNOSIS — G8929 Other chronic pain: Secondary | ICD-10-CM | POA: Diagnosis present

## 2020-08-07 DIAGNOSIS — M5442 Lumbago with sciatica, left side: Secondary | ICD-10-CM | POA: Insufficient documentation

## 2020-08-07 IMAGING — MR MR LUMBAR SPINE W/O CM
4 of 5 series · 28 of 48 positions shown · non-contrast
Comparison: [DATE]

CLINICAL DATA: Previous lumbar fusion surgery. Chronic low back
pain, now extending into the right hip and leg with some numbness.

EXAM:
MRI LUMBAR SPINE WITHOUT CONTRAST
TECHNIQUE: Multiplanar, multisequence MR imaging of the lumbar spine was
performed. No intravenous contrast was administered.

[Series 2: T2 · sagittal · 4.0mm · 1.02mm/px · 6 of 15 slices shown (1 of 2)]
[im 1/15]
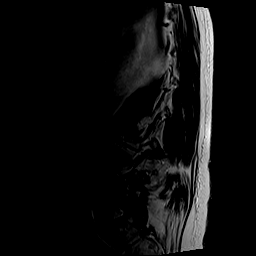
[im 3/15]
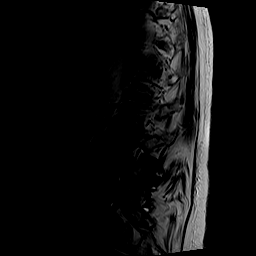
[im 6/15]
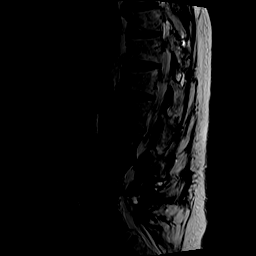
[im 9/15]
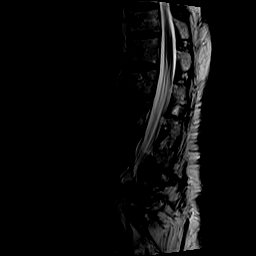
[im 12/15]
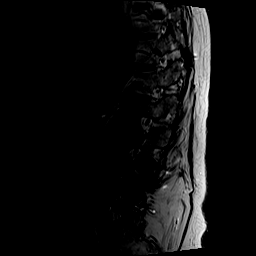
[im 15/15]
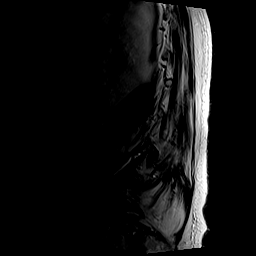

[Series 3: T1 · sagittal · 4.0mm · 0.51mm/px · 6 of 15 slices shown (1 of 2)]
[im 1/15]
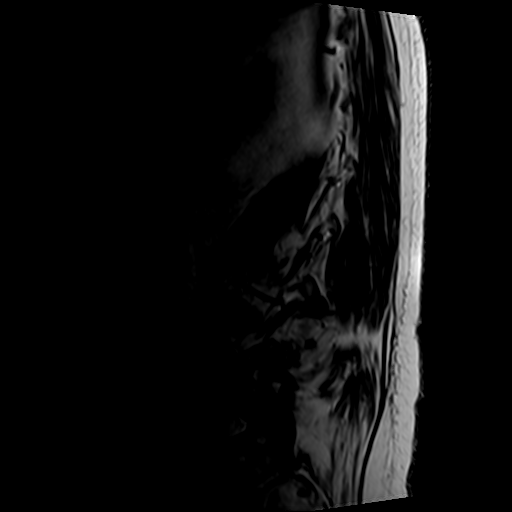
[im 3/15]
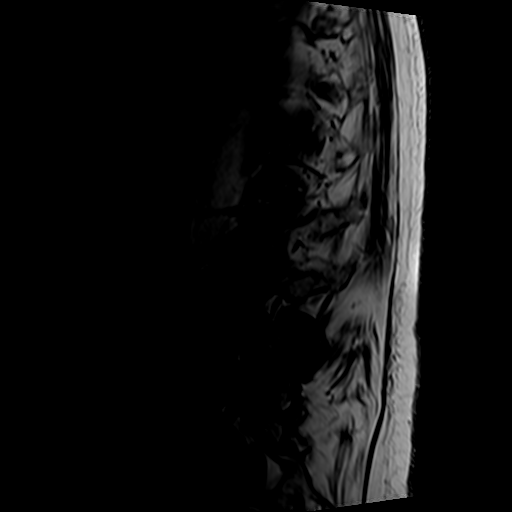
[im 6/15]
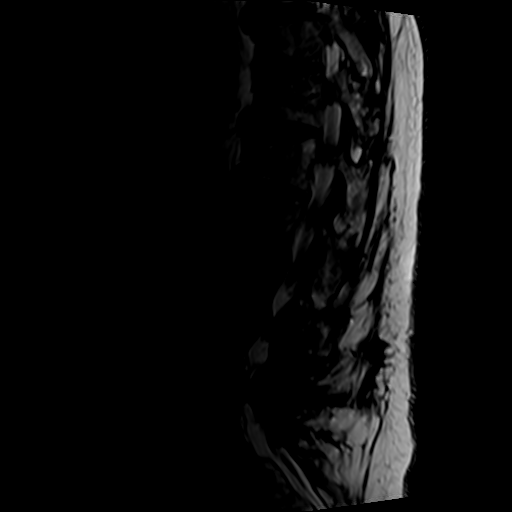
[im 9/15]
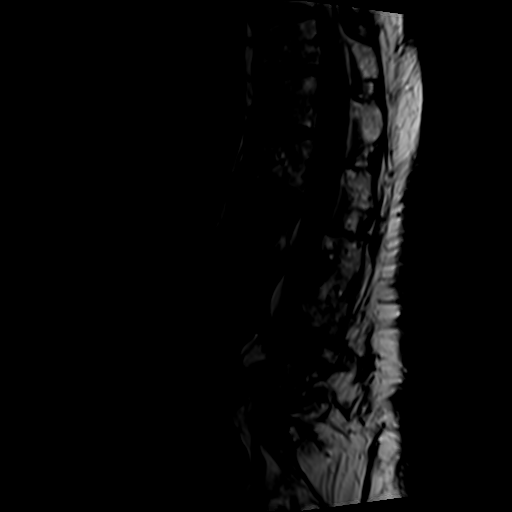
[im 12/15]
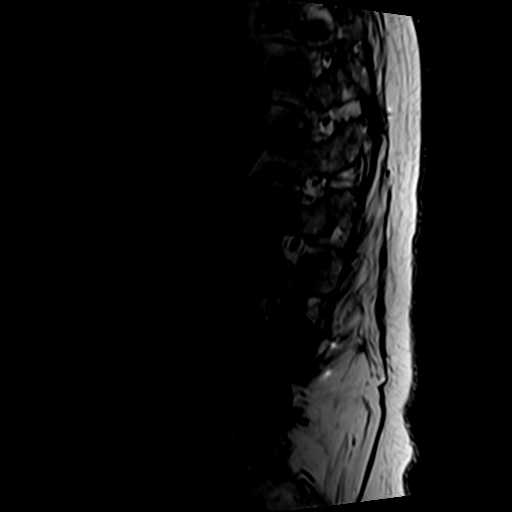
[im 15/15]
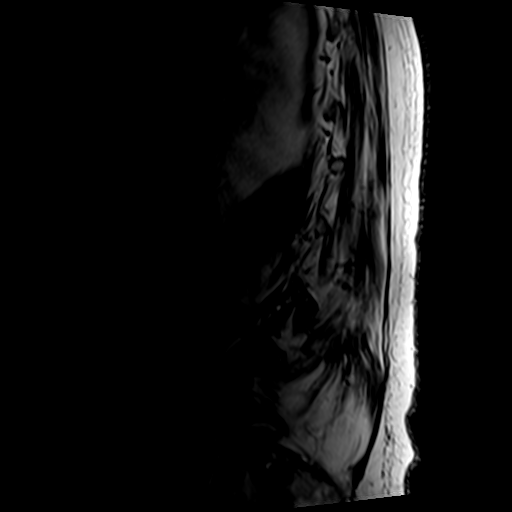

[Series 5: T2 · axial · 4.0mm · 0.78mm/px · z∈[-162,+53]mm · 9 of 41 slices shown (2 of 2)]
[im 1/41]
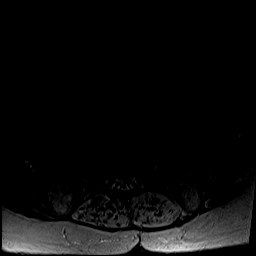
[im 6/41]
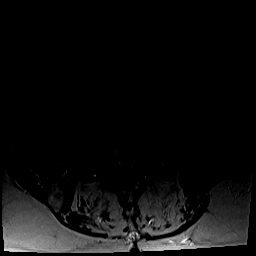
[im 12/41]
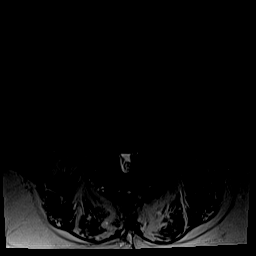
[im 18/41]
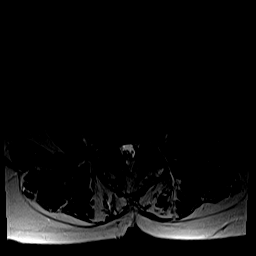
[im 21/41]
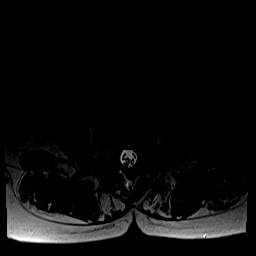
[im 23/41]
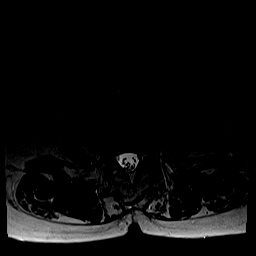
[im 29/41]
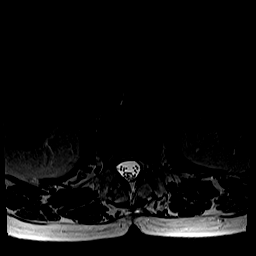
[im 35/41]
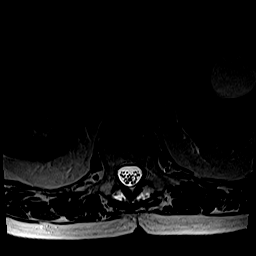
[im 41/41]
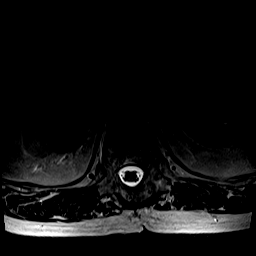

[Series 6: T1 · axial · 4.0mm · 0.39mm/px · z∈[-162,+23]mm · 7 of 41 slices shown (2 of 2)]
[im 1/41]
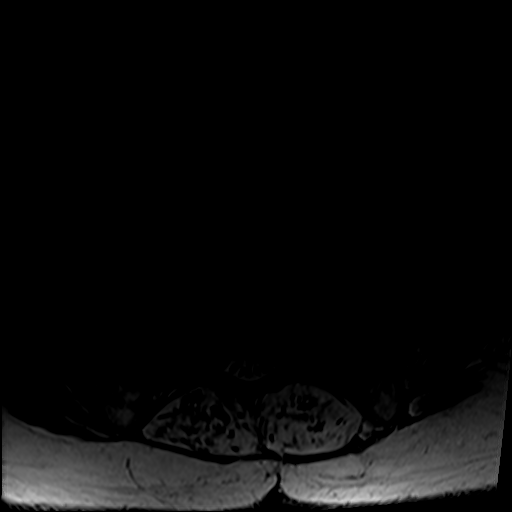
[im 6/41]
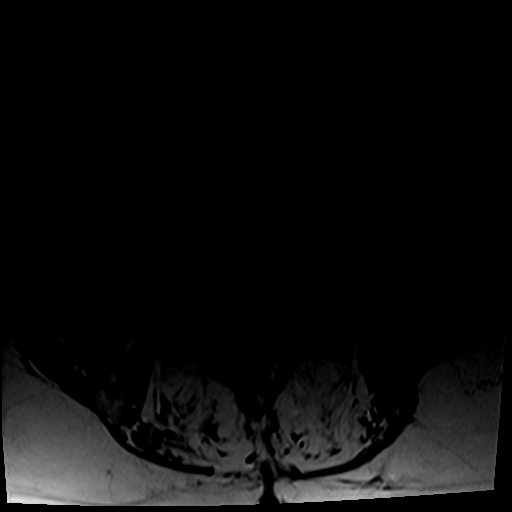
[im 12/41]
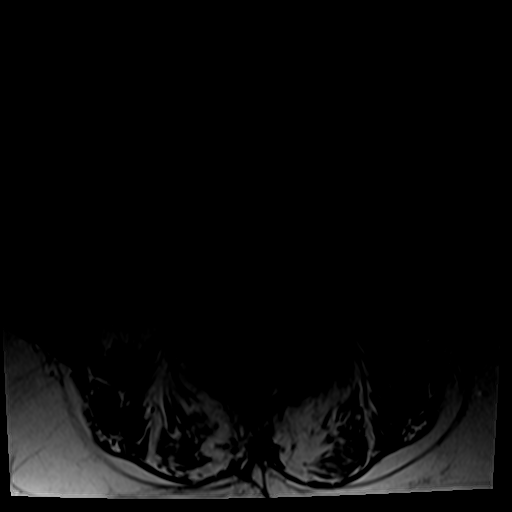
[im 18/41]
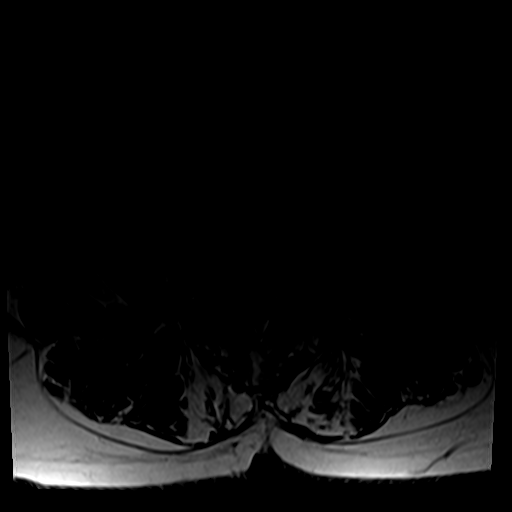
[im 21/41]
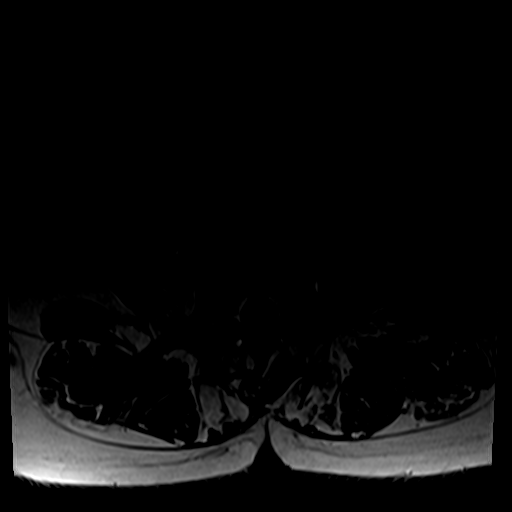
[im 23/41]
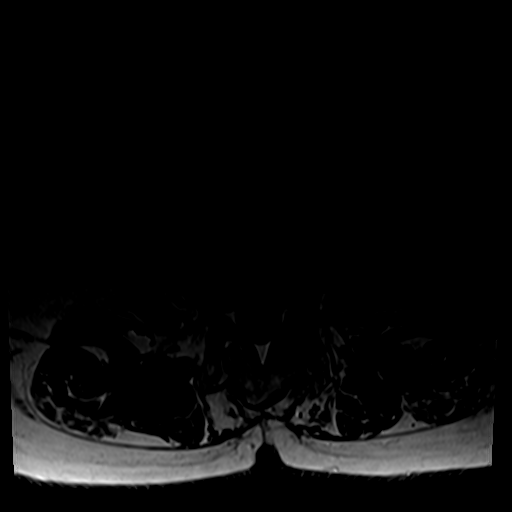
[im 35/41]
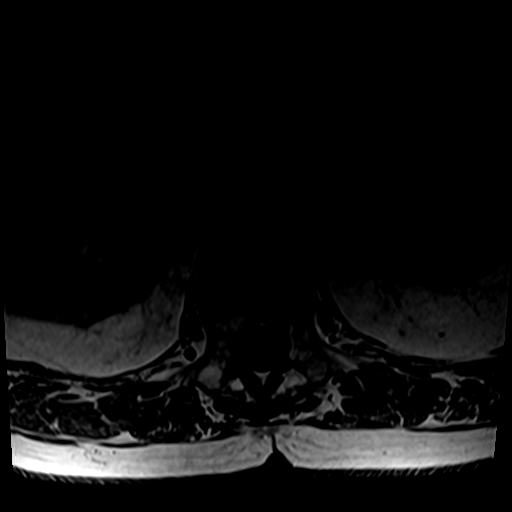

[28 of 48 positions shown; findings below may reference images not displayed]

FINDINGS: Segmentation: 5 lumbar type vertebral bodies as numbered previously.

Alignment: Mild thoracolumbar kyphotic curvature and slightly
exaggerated lordotic curvature in the lower lumbar region.

Vertebrae: Mild chronic superior endplate loss of height at L1, L2
and L3. No evidence of recent fracture or other cause of bone marrow
edema.

Conus medullaris and cauda equina: Conus extends to the L1 level.
Conus and cauda equina appear normal.

Paraspinal and other soft tissues: Negative scratch set simple
appearing cyst of the left kidney. Otherwise negative.

Disc levels:

No disc level abnormality from T11-12 through L2-3. Apparent solid
posterior fusion through that segment.

L3-4: Bilateral facet degeneration and hypertrophy. Endplate
osteophytes and bulging of the disc. Mild stenosis of the lateral
recesses but without likely neural compression.

L4-5: Endplate osteophytes and bulging of the disc. Previous
posterior decompression. Bilateral facet and ligamentous
hypertrophy. Numerous small synovial cysts, particularly emanating
from the facet on the right. Stenosis of the lateral recesses and
neural foramina that could cause neural compression on either or
both sides. The facet arthropathy could be painful.

L5-S1: Diminutive thecal sac. No disc pathology. Bilateral facet
osteoarthritis. No neural compressive stenosis. Findings at this
level could contribute to back pain.
IMPRESSION: Findings generally similar to the study [DATE].

Previous posterior fusion from the lower thoracic region to L3.

L3-4: Disc bulge. Facet degeneration and hypertrophy. Mild narrowing
of the lateral recesses but without likely neural compression.

L4-5: Previous posterior decompression. Endplate osteophytes and
bulging of the disc. Facet and ligamentous hypertrophy. Stenosis of
the lateral recesses and neural foramina that could possibly cause
neural compression. Facet arthropathy could be symptomatic. Small
synovial cysts on the right that contribute somewhat to the right
lateral recess narrowing

L5-S1: No disc pathology. Mild facet osteoarthritis. No stenosis.
Facet osteoarthritis could be symptomatic.

## 2020-09-18 ENCOUNTER — Inpatient Hospital Stay: Payer: Medicare Other | Attending: Oncology

## 2020-09-18 DIAGNOSIS — D472 Monoclonal gammopathy: Secondary | ICD-10-CM | POA: Diagnosis not present

## 2020-09-18 DIAGNOSIS — D509 Iron deficiency anemia, unspecified: Secondary | ICD-10-CM | POA: Insufficient documentation

## 2020-09-18 LAB — CBC WITH DIFFERENTIAL/PLATELET
Abs Immature Granulocytes: 0.02 10*3/uL (ref 0.00–0.07)
Basophils Absolute: 0 10*3/uL (ref 0.0–0.1)
Basophils Relative: 0 %
Eosinophils Absolute: 0.1 10*3/uL (ref 0.0–0.5)
Eosinophils Relative: 1 %
HCT: 43.3 % (ref 39.0–52.0)
Hemoglobin: 14.5 g/dL (ref 13.0–17.0)
Immature Granulocytes: 0 %
Lymphocytes Relative: 11 %
Lymphs Abs: 0.8 10*3/uL (ref 0.7–4.0)
MCH: 31.7 pg (ref 26.0–34.0)
MCHC: 33.5 g/dL (ref 30.0–36.0)
MCV: 94.7 fL (ref 80.0–100.0)
Monocytes Absolute: 0.2 10*3/uL (ref 0.1–1.0)
Monocytes Relative: 2 %
Neutro Abs: 5.8 10*3/uL (ref 1.7–7.7)
Neutrophils Relative %: 86 %
Platelets: 308 10*3/uL (ref 150–400)
RBC: 4.57 MIL/uL (ref 4.22–5.81)
RDW: 14.1 % (ref 11.5–15.5)
WBC: 6.8 10*3/uL (ref 4.0–10.5)
nRBC: 0 % (ref 0.0–0.2)

## 2020-09-18 LAB — IRON AND TIBC
Iron: 72 ug/dL (ref 45–182)
Saturation Ratios: 18 % (ref 17.9–39.5)
TIBC: 403 ug/dL (ref 250–450)
UIBC: 331 ug/dL

## 2020-09-18 LAB — FERRITIN: Ferritin: 17 ng/mL — ABNORMAL LOW (ref 24–336)

## 2020-10-01 ENCOUNTER — Encounter: Payer: Medicare Other | Admitting: Dermatology

## 2020-12-19 ENCOUNTER — Inpatient Hospital Stay: Payer: Medicare Other | Attending: Nurse Practitioner

## 2020-12-19 ENCOUNTER — Other Ambulatory Visit: Payer: Self-pay

## 2020-12-19 DIAGNOSIS — D472 Monoclonal gammopathy: Secondary | ICD-10-CM

## 2020-12-19 DIAGNOSIS — R779 Abnormality of plasma protein, unspecified: Secondary | ICD-10-CM | POA: Insufficient documentation

## 2020-12-19 LAB — COMPREHENSIVE METABOLIC PANEL
ALT: 19 U/L (ref 0–44)
AST: 20 U/L (ref 15–41)
Albumin: 4.4 g/dL (ref 3.5–5.0)
Alkaline Phosphatase: 52 U/L (ref 38–126)
Anion gap: 6 (ref 5–15)
BUN: 15 mg/dL (ref 8–23)
CO2: 27 mmol/L (ref 22–32)
Calcium: 9.1 mg/dL (ref 8.9–10.3)
Chloride: 104 mmol/L (ref 98–111)
Creatinine, Ser: 0.98 mg/dL (ref 0.61–1.24)
GFR, Estimated: >60 mL/min (ref >60)
Glucose, Bld: 94 mg/dL (ref 70–99)
Potassium: 4.6 mmol/L (ref 3.5–5.1)
Sodium: 137 mmol/L (ref 135–145)
Total Bilirubin: 0.7 mg/dL (ref 0.3–1.2)
Total Protein: 7 g/dL (ref 6.5–8.1)

## 2020-12-22 LAB — KAPPA/LAMBDA LIGHT CHAINS
Kappa free light chain: 19.5 mg/L — ABNORMAL HIGH (ref 3.3–19.4)
Kappa, lambda light chain ratio: 2.35 — ABNORMAL HIGH (ref 0.26–1.65)
Lambda free light chains: 8.3 mg/L (ref 5.7–26.3)

## 2020-12-23 LAB — MULTIPLE MYELOMA PANEL, SERUM
Albumin SerPl Elph-Mcnc: 4.3 g/dL (ref 2.9–4.4)
Albumin/Glob SerPl: 1.9 — ABNORMAL HIGH (ref 0.7–1.7)
Alpha 1: 0.2 g/dL (ref 0.0–0.4)
Alpha2 Glob SerPl Elph-Mcnc: 0.6 g/dL (ref 0.4–1.0)
B-Globulin SerPl Elph-Mcnc: 0.9 g/dL (ref 0.7–1.3)
Gamma Glob SerPl Elph-Mcnc: 0.7 g/dL (ref 0.4–1.8)
Globulin, Total: 2.3 g/dL (ref 2.2–3.9)
IgA: 118 mg/dL (ref 61–437)
IgG (Immunoglobin G), Serum: 742 mg/dL (ref 603–1613)
IgM (Immunoglobulin M), Srm: 10 mg/dL — ABNORMAL LOW (ref 20–172)
M Protein SerPl Elph-Mcnc: 0.3 g/dL — ABNORMAL HIGH
Total Protein ELP: 6.6 g/dL (ref 6.0–8.5)

## 2020-12-26 ENCOUNTER — Inpatient Hospital Stay: Payer: Medicare Other | Attending: Nurse Practitioner | Admitting: Nurse Practitioner

## 2020-12-26 ENCOUNTER — Other Ambulatory Visit: Payer: Self-pay

## 2020-12-26 VITALS — BP 113/84 | HR 60 | Temp 97.7°F | Wt 196.0 lb

## 2020-12-26 DIAGNOSIS — I10 Essential (primary) hypertension: Secondary | ICD-10-CM | POA: Insufficient documentation

## 2020-12-26 DIAGNOSIS — D509 Iron deficiency anemia, unspecified: Secondary | ICD-10-CM | POA: Insufficient documentation

## 2020-12-26 DIAGNOSIS — D472 Monoclonal gammopathy: Secondary | ICD-10-CM | POA: Diagnosis present

## 2020-12-26 DIAGNOSIS — Z87891 Personal history of nicotine dependence: Secondary | ICD-10-CM | POA: Insufficient documentation

## 2020-12-26 NOTE — Progress Notes (Signed)
Follow-up for MGUS. Pt here for lab results.

## 2020-12-26 NOTE — Progress Notes (Signed)
Hematology/Oncology Consult Note [facility] Telephone:([phone] [phone]  Patient Care Team: Derinda Late, MD as PCP - General (Family Medicine)   Name of the patient: Zachary Clements  [id number]  1956-01-25   Reason for referral-abnormal SPEP   Referring [place]  Date of visit: 12/26/20  History of presenting illness- Patient is a 65 year old male with a past medical history significant for hypertension hyperlipidemia cardiomyopathy and GERD who was seen by rheumatology for symptoms of inflammatory arthritis.  He was started on methotrexate weekly for the same.  As a part of the work-up he had multiple blood work checked including no CBC which showed a white count of 8.8, H&H of 12.2/39.1 with an MCV of 90 CMP was normal.  He also had SPEP checked which showed IgG lambda light chain specificity with an M protein of 0.2.  IgG level is normal at 1032.  Patient referred for further work-up  Mild anemia but no evidence of kidney injury or hypercalcemia. 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 Given that IgG M protein was less than 1.5, bone marrow was not recommended.   Iron deficiency anemia- mild anemia with hemoglobin 11-12. Iron studies consistent with iron deficiency with ferritin of 7 and elevated TIBC. He started oral iron. He had a positive cologuard test in January 2022 and underwent colonoscopy which was unremarkable. Endoscopy and capsule were recommended.   Interval History: Patient is 65 year old male who returns to clinic for follow up. He has mild fatigue but otherwise feels at baseline. He has not seen GI.   ECOG PS- 1  Pain scale- 0   Review of systems- Review of Systems  Constitutional:  Positive for malaise/fatigue. Negative for chills, fever and weight loss.  HENT:  Negative for congestion, ear discharge and nosebleeds.   Eyes:  Negative for blurred vision.  Respiratory:  Negative for cough,  hemoptysis, sputum production, shortness of breath and wheezing.   Cardiovascular:  Negative for chest pain, palpitations, orthopnea and claudication.  Gastrointestinal:  Negative for abdominal pain, blood in stool, constipation, diarrhea, heartburn, melena, nausea and vomiting.  Genitourinary:  Negative for dysuria, flank pain, frequency, hematuria and urgency.  Musculoskeletal:  Negative for back pain, joint pain and myalgias.  Skin:  Negative for rash.  Neurological:  Negative for dizziness, tingling, focal weakness, seizures, weakness and headaches.  Endo/Heme/Allergies:  Does not bruise/bleed easily.  Psychiatric/Behavioral:  Negative for depression and suicidal ideas. The patient does not have insomnia.    No Known Allergies  Patient Active Problem List   Diagnosis Date Noted   Long-term use of immunosuppressant medication 06/18/2020   MGUS (monoclonal gammopathy of unknown significance) 06/18/2020   Rheumatoid arthritis of multiple sites with negative rheumatoid factor ([place]) 06/18/2020   Paroxysmal atrial fibrillation ([place]) 11/12/2019   Complete heart block ([place]) 10/19/2019   CHB (complete heart block) ([place]) 10/19/2019   Other hyperlipidemia    Idiopathic peripheral neuropathy    Foraminal stenosis of lumbar region 07/13/2019   Hypertrophic obstructive cardiomyopathy (HOCM) ([place]) 12/20/2017   Polyp of colon 09/06/2017   Chest pain with high risk of acute coronary syndrome 03/30/2017   Exertional shortness of breath 03/30/2017   Near syncope 03/30/2017   Cardiomyopathy ([place]) 12/23/2016   Chronic bilateral low back pain with bilateral sciatica 12/23/2016   Essential hypertension 12/23/2016     Past Medical History:  Diagnosis Date   Arthritis    Dysrhythmia    GERD (gastroesophageal reflux disease)  Hypertension    Hypertrophic cardiomyopathy ([place])    Insomnia      Past Surgical History:  Procedure Laterality Date   BACK SURGERY     rods in back   BACK SURGERY      removal of rods in back   COLONOSCOPY WITH PROPOFOL N/A 03/05/2020   Procedure: COLONOSCOPY WITH PROPOFOL;  Surgeon: Toledo, Benay Pike, MD;  Location: [facility];  Service: Gastroenterology;  Laterality: N/A;   colonscopy     KNEE ARTHROSCOPY Right    LEFT HEART CATH AND CORONARY ANGIOGRAPHY N/A 10/19/2019   Procedure: LEFT HEART CATH AND CORONARY ANGIOGRAPHY;  Surgeon: Nelva Bush, MD;  Location: [facility];  Service: Cardiovascular;  Laterality: N/A;   LUMBAR LAMINECTOMY/DECOMPRESSION MICRODISCECTOMY N/A 11/09/2017   Procedure: LUMBAR LAMINECTOMY/DECOMPRESSION MICRODISCECTOMY 1 LEVEL-4-5;  Surgeon: Meade Maw, MD;  Location: [facility];  Service: Neurosurgery;  Laterality: N/A;   TEMPORARY PACEMAKER N/A 10/19/2019   Procedure: TEMPORARY PACEMAKER;  Surgeon: Nelva Bush, MD;  Location: [facility];  Service: Cardiovascular;  Laterality: N/A;    Social History   Socioeconomic History   Marital status: Married    Spouse name: Not on file   Number of children: Not on file   Years of education: Not on file   Highest education level: Not on file  Occupational History   Not on file  Tobacco Use   Smoking status: Former    Types: Cigarettes    Quit date: 11/04/1998    Years since quitting: 22.1   Smokeless tobacco: Never  Vaping Use   Vaping Use: Never used  Substance and Sexual Activity   Alcohol use: Not Currently   Drug use: Not on file    Comment: cbd oil   Sexual activity: Not on file  Other Topics Concern   Not on file  Social History Narrative   Not on file   Social Determinants of Health   Financial Resource Strain: Not on file  Food Insecurity: Not on file  Transportation Needs: Not on file  Physical Activity: Not on file  Stress: Not on file  Social Connections: Not on file  Intimate Partner Violence: Not on file     No family history on file.   Current Outpatient Medications:    atorvastatin (LIPITOR) 10 MG tablet,  Take 10 mg by mouth daily with supper., Disp: , Rfl:    DULoxetine (CYMBALTA) 60 MG capsule, Take 120 mg by mouth daily., Disp: , Rfl:    gabapentin (NEURONTIN) 300 MG capsule, Take 2 capsules by mouth in the morning, at noon, and at bedtime., Disp: , Rfl:    HYDROcodone-acetaminophen (NORCO) 10-325 MG tablet, Take 1 tablet by mouth 2 (two) times daily as needed., Disp: , Rfl:    metoprolol succinate (TOPROL-XL) 25 MG 24 hr tablet, TAKE 1/2 TABLET(12.5 MG) BY MOUTH EVERY DAY, Disp: , Rfl:    Multiple Vitamin (MULTIVITAMIN WITH MINERALS) TABS tablet, Take 1 tablet by mouth daily., Disp: , Rfl:    timolol (TIMOPTIC) 0.5 % ophthalmic solution, Place 1 drop into both eyes daily. , Disp: , Rfl:    traZODone (DESYREL) 50 MG tablet, Take 1 tablet by mouth at bedtime., Disp: , Rfl:    predniSONE (DELTASONE) 5 MG tablet, Take by mouth., Disp: , Rfl:    Physical exam:  Today's Vitals   12/26/20 1314  BP: 113/84  Pulse: 60  Temp: 97.7 F (36.5 C)  TempSrc: Tympanic  SpO2: 96%  Weight: 196 lb (88.9  kg)   Body mass index is 28.12 kg/m.  Physical Exam Cardiovascular:     Rate and Rhythm: Normal rate and regular rhythm.     Heart sounds: Normal heart sounds.  Pulmonary:     Effort: Pulmonary effort is normal.     Breath sounds: Normal breath sounds.  Abdominal:     General: Bowel sounds are normal.     Palpations: Abdomen is soft.  Skin:    General: Skin is warm and dry.  Neurological:     Mental Status: He is alert and oriented to person, place, and time.    CMP Latest Ref Rng & Units 12/19/2020 06/09/2020 10/20/2019  Glucose 70 - 99 mg/dL 94 81 100(H)  BUN 8 - 23 mg/dL 15 16 26(H)  Creatinine 0.61 - 1.24 mg/dL 0.98 1.24 1.16  Sodium 135 - 145 mmol/L 137 135 138  Potassium 3.5 - 5.1 mmol/L 4.6 3.9 4.1  Chloride 98 - 111 mmol/L 104 100 106  CO2 22 - 32 mmol/L 27 26 26   Calcium 8.9 - 10.3 mg/dL 9.1 9.4 8.4(L)  Total Protein 6.5 - 8.1 g/dL 7.0 7.2 -  Total Bilirubin 0.3 - 1.2 mg/dL 0.7  0.8 -  Alkaline Phos 38 - 126 U/L 52 51 -  AST 15 - 41 U/L 20 18 -  ALT 0 - 44 U/L 19 19 -   CMP Latest Ref Rng & Units 12/19/2020 06/09/2020 10/20/2019  Glucose 70 - 99 mg/dL 94 81 100(H)  BUN 8 - 23 mg/dL 15 16 26(H)  Creatinine 0.61 - 1.24 mg/dL 0.98 1.24 1.16  Sodium 135 - 145 mmol/L 137 135 138  Potassium 3.5 - 5.1 mmol/L 4.6 3.9 4.1  Chloride 98 - 111 mmol/L 104 100 106  CO2 22 - 32 mmol/L 27 26 26   Calcium 8.9 - 10.3 mg/dL 9.1 9.4 8.4(L)  Total Protein 6.5 - 8.1 g/dL 7.0 7.2 -  Total Bilirubin 0.3 - 1.2 mg/dL 0.7 0.8 -  Alkaline Phos 38 - 126 U/L 52 51 -  AST 15 - 41 U/L 20 18 -  ALT 0 - 44 U/L 19 19 -   Iron/TIBC/Ferritin/ %Sat    Component Value Date/Time   IRON 72 09/18/2020 1438   TIBC 403 09/18/2020 1438   FERRITIN 17 (L) 09/18/2020 1438   IRONPCTSAT 18 09/18/2020 1438     Assessment and plan- Patient is a 65 y.o. male   MGUS- reviewed low risk of transformation. No crab criteria based on blood work. M protein 0.3. kappa lambda light chain ratio elevated at 2.35. No indication for bone marrow at this time. Continue to follow.  Iron Deficiency Anemia- previously labs consistent with iron deficiency. Labs not drawn today. He declined redraw. Unremarkable colonoscopy. Recommended that he see GI for evaluation and possible endoscopy and/or capsule study.   6 months- labs (cbc, cmp, ferritin, iron studies, spep, kappa lambda light chains), then day to week later see Dr. Janese Banks (in person or virtual)   Thank you for this kind referral and the opportunity to participate in the care of this patient  Visit Diagnosis 1. MGUS (monoclonal gammopathy of unknown significance)   2. Iron deficiency anemia, unspecified iron deficiency anemia type    Beckey Rutter, DNP, AGNP-C [place] at [facility] [phone] (clinic) 12/26/2020

## 2021-06-23 ENCOUNTER — Inpatient Hospital Stay: Payer: Medicare Other | Attending: Oncology

## 2021-06-23 DIAGNOSIS — I10 Essential (primary) hypertension: Secondary | ICD-10-CM | POA: Diagnosis not present

## 2021-06-23 DIAGNOSIS — D472 Monoclonal gammopathy: Secondary | ICD-10-CM | POA: Diagnosis present

## 2021-06-23 DIAGNOSIS — Z87891 Personal history of nicotine dependence: Secondary | ICD-10-CM | POA: Diagnosis not present

## 2021-06-23 DIAGNOSIS — D509 Iron deficiency anemia, unspecified: Secondary | ICD-10-CM | POA: Insufficient documentation

## 2021-06-23 LAB — COMPREHENSIVE METABOLIC PANEL
ALT: 18 U/L (ref 0–44)
AST: 24 U/L (ref 15–41)
Albumin: 4.1 g/dL (ref 3.5–5.0)
Alkaline Phosphatase: 49 U/L (ref 38–126)
Anion gap: 8 (ref 5–15)
BUN: 13 mg/dL (ref 8–23)
CO2: 25 mmol/L (ref 22–32)
Calcium: 9.2 mg/dL (ref 8.9–10.3)
Chloride: 105 mmol/L (ref 98–111)
Creatinine, Ser: 1.1 mg/dL (ref 0.61–1.24)
GFR, Estimated: >60 mL/min (ref >60)
Glucose, Bld: 102 mg/dL — ABNORMAL HIGH (ref 70–99)
Potassium: 4.1 mmol/L (ref 3.5–5.1)
Sodium: 138 mmol/L (ref 135–145)
Total Bilirubin: 0.6 mg/dL (ref 0.3–1.2)
Total Protein: 6.9 g/dL (ref 6.5–8.1)

## 2021-06-23 LAB — CBC WITH DIFFERENTIAL/PLATELET
Abs Immature Granulocytes: 0.03 10*3/uL (ref 0.00–0.07)
Basophils Absolute: 0.1 10*3/uL (ref 0.0–0.1)
Basophils Relative: 1 %
Eosinophils Absolute: 0.3 10*3/uL (ref 0.0–0.5)
Eosinophils Relative: 5 %
HCT: 47.4 % (ref 39.0–52.0)
Hemoglobin: 16 g/dL (ref 13.0–17.0)
Immature Granulocytes: 1 %
Lymphocytes Relative: 22 %
Lymphs Abs: 1.3 10*3/uL (ref 0.7–4.0)
MCH: 32.1 pg (ref 26.0–34.0)
MCHC: 33.8 g/dL (ref 30.0–36.0)
MCV: 95.2 fL (ref 80.0–100.0)
Monocytes Absolute: 0.4 10*3/uL (ref 0.1–1.0)
Monocytes Relative: 7 %
Neutro Abs: 3.7 10*3/uL (ref 1.7–7.7)
Neutrophils Relative %: 64 %
Platelets: 312 10*3/uL (ref 150–400)
RBC: 4.98 MIL/uL (ref 4.22–5.81)
RDW: 12.6 % (ref 11.5–15.5)
WBC: 5.7 10*3/uL (ref 4.0–10.5)
nRBC: 0 % (ref 0.0–0.2)

## 2021-06-23 LAB — IRON AND TIBC
Iron: 117 ug/dL (ref 45–182)
Saturation Ratios: 31 % (ref 17.9–39.5)
TIBC: 375 ug/dL (ref 250–450)
UIBC: 258 ug/dL

## 2021-06-23 LAB — VITAMIN B12: Vitamin B-12: 3627 pg/mL — ABNORMAL HIGH (ref 180–914)

## 2021-06-23 LAB — FOLATE: Folate: 10.9 ng/mL (ref >5.9)

## 2021-06-23 LAB — FERRITIN: Ferritin: 17 ng/mL — ABNORMAL LOW (ref 24–336)

## 2021-06-24 LAB — KAPPA/LAMBDA LIGHT CHAINS
Kappa free light chain: 18.9 mg/L (ref 3.3–19.4)
Kappa, lambda light chain ratio: 2.25 — ABNORMAL HIGH (ref 0.26–1.65)
Lambda free light chains: 8.4 mg/L (ref 5.7–26.3)

## 2021-06-25 ENCOUNTER — Encounter: Payer: Self-pay | Admitting: Oncology

## 2021-06-25 ENCOUNTER — Inpatient Hospital Stay: Payer: Medicare Other | Admitting: Nurse Practitioner

## 2021-06-25 VITALS — BP 124/85 | HR 68 | Temp 97.6°F | Resp 18 | Wt 192.2 lb

## 2021-06-25 DIAGNOSIS — D472 Monoclonal gammopathy: Secondary | ICD-10-CM

## 2021-06-25 DIAGNOSIS — D509 Iron deficiency anemia, unspecified: Secondary | ICD-10-CM

## 2021-06-25 NOTE — Progress Notes (Signed)
?Hematology/Oncology Consult Note ?[place] ?[phone] [phone] ? ?Patient Care Team: ?Derinda Late, MD as PCP - General (Family Medicine)  ? ?Name of the patient: Zachary Clements  ?[id number]  ?[birth year]  ? ?Reason for referral-abnormal SPEP ?  ?Referring physician-Dr. Posey Pronto ? ?Date of visit: 06/25/21 ? ?History of presenting illness- Patient is a 66 year old male with a past medical history significant for hypertension hyperlipidemia cardiomyopathy and GERD who was seen by rheumatology for symptoms of inflammatory arthritis.  He was started on methotrexate weekly for the same.  As a part of the work-up he had multiple blood work checked including no CBC which showed a white count of 8.8, H&H of 12.2/39.1 with an MCV of 90 CMP was normal.  He also had SPEP checked which showed IgG lambda light chain specificity with an M protein of 0.2.  IgG level is normal at 1032.  Patient referred for further work-up ? ?Mild anemia but no evidence of kidney injury or hypercalcemia. 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 Given that IgG M protein was less than 1.5, bone marrow was not recommended.  ? ?Iron deficiency anemia- mild anemia with hemoglobin 11-12. Iron studies consistent with iron deficiency with ferritin of 7 and elevated TIBC. He started oral iron. He had a positive cologuard test in January 2022 and underwent colonoscopy which was unremarkable. Endoscopy and capsule were recommended.  ? ?Interval History: Patient is 66 year old male who returns to clinic for follow up. He has mild fatigue but otherwise feels at baseline. He has shortness of breath and chest pain at times which he has accounted to his history of heart disease. No bone pain, frequent infections, fevers, chills, night sweat. He takes oral iron.  ? ?ECOG PS- 1 ? ?Pain scale- 0 ? ?Review of systems- Review of Systems  ?Constitutional:  Positive for malaise/fatigue. Negative for  chills, fever and weight loss.  ?HENT:  Negative for congestion, ear discharge and nosebleeds.   ?Eyes:  Negative for blurred vision.  ?Respiratory:  Negative for cough, hemoptysis, sputum production, shortness of breath and wheezing.   ?Cardiovascular:  Negative for chest pain, palpitations, orthopnea and claudication.  ?Gastrointestinal:  Negative for abdominal pain, blood in stool, constipation, diarrhea, heartburn, melena, nausea and vomiting.  ?Genitourinary:  Negative for dysuria, flank pain, frequency, hematuria and urgency.  ?Musculoskeletal:  Negative for back pain, joint pain and myalgias.  ?Skin:  Negative for rash.  ?Neurological:  Negative for dizziness, tingling, focal weakness, seizures, weakness and headaches.  ?Endo/Heme/Allergies:  Does not bruise/bleed easily.  ?Psychiatric/Behavioral:  Negative for depression and suicidal ideas. The patient does not have insomnia.   ? ?No Known Allergies ? ?Patient Active Problem List  ? Diagnosis Date Noted  ? Long-term use of immunosuppressant medication 06/18/2020  ? MGUS (monoclonal gammopathy of unknown significance) 06/18/2020  ? Rheumatoid arthritis of multiple sites with negative rheumatoid factor ([place]) 06/18/2020  ? Paroxysmal atrial fibrillation ([place]) 11/12/2019  ? Complete heart block ([place]) 10/19/2019  ? CHB (complete heart block) ([place]) 10/19/2019  ? Other hyperlipidemia   ? Idiopathic peripheral neuropathy   ? Foraminal stenosis of lumbar region 07/13/2019  ? Hypertrophic obstructive cardiomyopathy (HOCM) ([place]) 12/20/2017  ? Polyp of colon 09/06/2017  ? Chest pain with high risk of acute coronary syndrome 03/30/2017  ? Exertional shortness of breath 03/30/2017  ? Near syncope 03/30/2017  ? Cardiomyopathy ([place]) 12/23/2016  ? Chronic bilateral low back pain with bilateral sciatica 12/23/2016  ? Essential  hypertension 12/23/2016  ? ? ?Past Medical History:  ?Diagnosis Date  ? Arthritis   ? Dysrhythmia   ? GERD (gastroesophageal reflux disease)   ?  Hypertension   ? Hypertrophic cardiomyopathy ([place])   ? Insomnia   ? ? ?Past Surgical History:  ?Procedure Laterality Date  ? BACK SURGERY    ? rods in back  ? BACK SURGERY    ? removal of rods in back  ? COLONOSCOPY WITH PROPOFOL N/A 03/05/2020  ? Procedure: COLONOSCOPY WITH PROPOFOL;  Surgeon: Toledo, Benay Pike, MD;  Location: [facility];  Service: Gastroenterology;  Laterality: N/A;  ? colonscopy    ? KNEE ARTHROSCOPY Right   ? LEFT HEART CATH AND CORONARY ANGIOGRAPHY N/A 10/19/2019  ? Procedure: LEFT HEART CATH AND CORONARY ANGIOGRAPHY;  Surgeon: Nelva Bush, MD;  Location: [facility];  Service: Cardiovascular;  Laterality: N/A;  ? LUMBAR LAMINECTOMY/DECOMPRESSION MICRODISCECTOMY N/A 11/09/2017  ? Procedure: LUMBAR LAMINECTOMY/DECOMPRESSION MICRODISCECTOMY 1 LEVEL-4-5;  Surgeon: Meade Maw, MD;  Location: [facility];  Service: Neurosurgery;  Laterality: N/A;  ? TEMPORARY PACEMAKER N/A 10/19/2019  ? Procedure: TEMPORARY PACEMAKER;  Surgeon: Nelva Bush, MD;  Location: [facility];  Service: Cardiovascular;  Laterality: N/A;  ? ? ?Social History  ? ?Socioeconomic History  ? Marital status: Married  ?  Spouse name: Not on file  ? Number of children: Not on file  ? Years of education: Not on file  ? Highest education level: Not on file  ?Occupational History  ? Not on file  ?Tobacco Use  ? Smoking status: Former  ?  Types: Cigarettes  ?  Quit date: 11/04/1998  ?  Years since quitting: 22.6  ? Smokeless tobacco: Never  ?Vaping Use  ? Vaping Use: Never used  ?Substance and Sexual Activity  ? Alcohol use: Not Currently  ? Drug use: Not on file  ?  Comment: cbd oil  ? Sexual activity: Not on file  ?Other Topics Concern  ? Not on file  ?Social History Narrative  ? Not on file  ? ?Social Determinants of Health  ? ?Financial Resource Strain: Not on file  ?Food Insecurity: Not on file  ?Transportation Needs: Not on file  ?Physical Activity: Not on file  ?Stress: Not on file  ?Social  Connections: Not on file  ?Intimate Partner Violence: Not on file  ? ?  ?History reviewed. No pertinent family history. ? ? ?Current Outpatient Medications:  ?  atorvastatin (LIPITOR) 10 MG tablet, Take 10 mg by mouth daily with supper., Disp: , Rfl:  ?  DULoxetine (CYMBALTA) 60 MG capsule, Take 120 mg by mouth daily., Disp: , Rfl:  ?  gabapentin (NEURONTIN) 300 MG capsule, Take 300 mg by mouth in the morning, at noon, and at bedtime., Disp: , Rfl:  ?  HYDROcodone-acetaminophen (NORCO) 10-325 MG tablet, Take 1 tablet by mouth 2 (two) times daily as needed., Disp: , Rfl:  ?  metoprolol succinate (TOPROL-XL) 25 MG 24 hr tablet, TAKE 1/2 TABLET(12.5 MG) BY MOUTH EVERY DAY, Disp: , Rfl:  ?  Multiple Vitamin (MULTIVITAMIN WITH MINERALS) TABS tablet, Take 1 tablet by mouth daily., Disp: , Rfl:  ?  timolol (TIMOPTIC) 0.5 % ophthalmic solution, Place 1 drop into both eyes daily. , Disp: , Rfl:  ?  traZODone (DESYREL) 50 MG tablet, Take 1 tablet by mouth at bedtime., Disp: , Rfl:  ? ? ?Physical exam:  ?Today's Vitals  ? 06/25/21 1416  ?BP: 124/85  ?Pulse: 68  ?Resp: 18  ?  Temp: 97.6 ?F (36.4 ?C)  ?Weight: 192 lb 3.2 oz (87.2 kg)  ?PainSc: 0-No pain  ? ?Body mass index is 27.58 kg/m?. ? ?Physical Exam ?Cardiovascular:  ?   Rate and Rhythm: Normal rate and regular rhythm.  ?   Heart sounds: Normal heart sounds.  ?Pulmonary:  ?   Effort: Pulmonary effort is normal.  ?   Breath sounds: Normal breath sounds.  ?Abdominal:  ?   General: Bowel sounds are normal.  ?   Palpations: Abdomen is soft.  ?Skin: ?   General: Skin is warm and dry.  ?Neurological:  ?   Mental Status: He is alert and oriented to person, place, and time.  ?  ? ?  Latest Ref Rng & Units 06/23/2021  ?  1:31 PM 12/19/2020  ?  2:24 PM 06/09/2020  ?  3:39 PM  ?CMP  ?Glucose 70 - 99 mg/dL 102   94   81    ?BUN 8 - 23 mg/dL '13   15   16    '$ ?Creatinine 0.61 - 1.24 mg/dL 1.10   0.98   1.24    ?Sodium 135 - 145 mmol/L 138   137   135    ?Potassium 3.5 - 5.1 mmol/L 4.1   4.6    3.9    ?Chloride 98 - 111 mmol/L 105   104   100    ?CO2 22 - 32 mmol/L '25   27   26    '$ ?Calcium 8.9 - 10.3 mg/dL 9.2   9.1   9.4    ?Total Protein 6.5 - 8.1 g/dL 6.9   7.0   7.2    ?Total Bilirubin 0.3 - 1.2 mg/dL

## 2021-06-25 NOTE — Progress Notes (Signed)
Patient here for MGUS follow up.  ?

## 2021-06-26 ENCOUNTER — Telehealth: Payer: Self-pay | Admitting: *Deleted

## 2021-06-26 LAB — MULTIPLE MYELOMA PANEL, SERUM
Albumin SerPl Elph-Mcnc: 3.8 g/dL (ref 2.9–4.4)
Albumin/Glob SerPl: 1.6 (ref 0.7–1.7)
Alpha 1: 0.3 g/dL (ref 0.0–0.4)
Alpha2 Glob SerPl Elph-Mcnc: 0.6 g/dL (ref 0.4–1.0)
B-Globulin SerPl Elph-Mcnc: 0.9 g/dL (ref 0.7–1.3)
Gamma Glob SerPl Elph-Mcnc: 0.7 g/dL (ref 0.4–1.8)
Globulin, Total: 2.4 g/dL (ref 2.2–3.9)
IgA: 117 mg/dL (ref 61–437)
IgG (Immunoglobin G), Serum: 788 mg/dL (ref 603–1613)
IgM (Immunoglobulin M), Srm: 12 mg/dL — ABNORMAL LOW (ref 20–172)
M Protein SerPl Elph-Mcnc: 0.3 g/dL — ABNORMAL HIGH
Total Protein ELP: 6.2 g/dL (ref 6.0–8.5)

## 2021-06-26 NOTE — Telephone Encounter (Signed)
Per message from Beckey Rutter, NP call patient with lab results. Lab results normal and unchanged, continue to monitor. Patient verbalized understanding and appreciative of call.  ?

## 2021-11-23 ENCOUNTER — Inpatient Hospital Stay
Admission: EM | Admit: 2021-11-23 | Discharge: 2021-11-26 | DRG: 229 | Disposition: A | Discharge status: Home / Self Care | Payer: Medicare Other | Attending: Osteopathic Medicine | Admitting: Osteopathic Medicine

## 2021-11-23 ENCOUNTER — Emergency Department: Payer: Medicare Other

## 2021-11-23 DIAGNOSIS — I2510 Atherosclerotic heart disease of native coronary artery without angina pectoris: Secondary | ICD-10-CM | POA: Diagnosis present

## 2021-11-23 DIAGNOSIS — I421 Obstructive hypertrophic cardiomyopathy: Secondary | ICD-10-CM | POA: Diagnosis present

## 2021-11-23 DIAGNOSIS — G4700 Insomnia, unspecified: Secondary | ICD-10-CM | POA: Diagnosis present

## 2021-11-23 DIAGNOSIS — G8929 Other chronic pain: Secondary | ICD-10-CM | POA: Diagnosis present

## 2021-11-23 DIAGNOSIS — M5442 Lumbago with sciatica, left side: Secondary | ICD-10-CM | POA: Diagnosis present

## 2021-11-23 DIAGNOSIS — I442 Atrioventricular block, complete: Secondary | ICD-10-CM | POA: Diagnosis present

## 2021-11-23 DIAGNOSIS — I480 Paroxysmal atrial fibrillation: Secondary | ICD-10-CM | POA: Diagnosis present

## 2021-11-23 DIAGNOSIS — R531 Weakness: Secondary | ICD-10-CM

## 2021-11-23 DIAGNOSIS — Z87891 Personal history of nicotine dependence: Secondary | ICD-10-CM

## 2021-11-23 DIAGNOSIS — R001 Bradycardia, unspecified: Principal | ICD-10-CM | POA: Diagnosis present

## 2021-11-23 DIAGNOSIS — K219 Gastro-esophageal reflux disease without esophagitis: Secondary | ICD-10-CM | POA: Diagnosis present

## 2021-11-23 DIAGNOSIS — I10 Essential (primary) hypertension: Secondary | ICD-10-CM | POA: Diagnosis present

## 2021-11-23 DIAGNOSIS — M5441 Lumbago with sciatica, right side: Secondary | ICD-10-CM | POA: Diagnosis present

## 2021-11-23 DIAGNOSIS — Z79899 Other long term (current) drug therapy: Secondary | ICD-10-CM

## 2021-11-23 DIAGNOSIS — I422 Other hypertrophic cardiomyopathy: Secondary | ICD-10-CM | POA: Diagnosis present

## 2021-11-23 LAB — CBC
HCT: 44.1 % (ref 39.0–52.0)
Hemoglobin: 14.3 g/dL (ref 13.0–17.0)
MCH: 31.4 pg (ref 26.0–34.0)
MCHC: 32.4 g/dL (ref 30.0–36.0)
MCV: 96.7 fL (ref 80.0–100.0)
Platelets: 257 10*3/uL (ref 150–400)
RBC: 4.56 MIL/uL (ref 4.22–5.81)
RDW: 13 % (ref 11.5–15.5)
WBC: 8.8 10*3/uL (ref 4.0–10.5)
nRBC: 0 % (ref 0.0–0.2)

## 2021-11-23 LAB — COMPREHENSIVE METABOLIC PANEL WITH GFR
ALT: 24 U/L (ref 0–44)
AST: 26 U/L (ref 15–41)
Albumin: 3.8 g/dL (ref 3.5–5.0)
Alkaline Phosphatase: 42 U/L (ref 38–126)
Anion gap: 8 (ref 5–15)
BUN: 17 mg/dL (ref 8–23)
CO2: 23 mmol/L (ref 22–32)
Calcium: 8.9 mg/dL (ref 8.9–10.3)
Chloride: 109 mmol/L (ref 98–111)
Creatinine, Ser: 1.19 mg/dL (ref 0.61–1.24)
GFR, Estimated: >60 mL/min (ref >60)
Glucose, Bld: 149 mg/dL — ABNORMAL HIGH (ref 70–99)
Potassium: 3.8 mmol/L (ref 3.5–5.1)
Sodium: 140 mmol/L (ref 135–145)
Total Bilirubin: 0.7 mg/dL (ref 0.3–1.2)
Total Protein: 6.2 g/dL — ABNORMAL LOW (ref 6.5–8.1)

## 2021-11-23 LAB — TROPONIN I (HIGH SENSITIVITY)
Troponin I (High Sensitivity): 14 ng/L (ref <18)
Troponin I (High Sensitivity): 14 ng/L (ref <18)
Troponin I (High Sensitivity): 14 ng/L (ref <18)

## 2021-11-23 LAB — HIV ANTIBODY (ROUTINE TESTING W REFLEX): HIV Screen 4th Generation wRfx: NONREACTIVE

## 2021-11-23 LAB — TSH: TSH: 3.863 u[IU]/mL (ref 0.350–4.500)

## 2021-11-23 MED ORDER — DULOXETINE HCL 30 MG PO CPEP
120.0000 mg | ORAL_CAPSULE | Freq: Every day | ORAL | Status: DC
  Administered 2021-11-24 – 2021-11-26 (×2): 120 mg via ORAL
  Filled 2021-11-23 (×2): qty 4

## 2021-11-23 MED ORDER — SODIUM CHLORIDE 0.9 % IV SOLN: 250.0000 mL | INTRAVENOUS | Status: DC | PRN

## 2021-11-23 MED ORDER — GABAPENTIN 300 MG PO CAPS
300.0000 mg | ORAL_CAPSULE | Freq: Three times a day (TID) | ORAL | Status: DC
  Administered 2021-11-23 – 2021-11-26 (×8): 300 mg via ORAL
  Filled 2021-11-23 (×8): qty 1

## 2021-11-23 MED ORDER — ADULT MULTIVITAMIN W/MINERALS CH
1.0000 | ORAL_TABLET | Freq: Every day | ORAL | Status: DC
  Administered 2021-11-24 – 2021-11-26 (×2): 1 via ORAL
  Filled 2021-11-23 (×2): qty 1

## 2021-11-23 MED ORDER — ENOXAPARIN SODIUM 40 MG/0.4ML IJ SOSY
40.0000 mg | PREFILLED_SYRINGE | INTRAMUSCULAR | Status: DC
  Filled 2021-11-23 (×3): qty 0.4

## 2021-11-23 MED ORDER — ATORVASTATIN CALCIUM 10 MG PO TABS
10.0000 mg | ORAL_TABLET | Freq: Every day | ORAL | Status: DC
  Administered 2021-11-24 – 2021-11-25 (×2): 10 mg via ORAL
  Filled 2021-11-23 (×2): qty 1

## 2021-11-23 MED ORDER — ONDANSETRON HCL 4 MG PO TABS: 4.0000 mg | ORAL_TABLET | Freq: Four times a day (QID) | ORAL | Status: DC | PRN

## 2021-11-23 MED ORDER — ACETAMINOPHEN 650 MG RE SUPP: 650.0000 mg | Freq: Four times a day (QID) | RECTAL | Status: DC | PRN

## 2021-11-23 MED ORDER — SODIUM CHLORIDE 0.9 % IV SOLN: INTRAVENOUS | Status: DC

## 2021-11-23 MED ORDER — HYDROCODONE-ACETAMINOPHEN 10-325 MG PO TABS
1.0000 | ORAL_TABLET | Freq: Two times a day (BID) | ORAL | Status: DC | PRN
  Administered 2021-11-23 – 2021-11-25 (×5): 1 via ORAL
  Filled 2021-11-23 (×5): qty 1

## 2021-11-23 MED ORDER — ONDANSETRON HCL 4 MG/2ML IJ SOLN: 4.0000 mg | Freq: Four times a day (QID) | INTRAMUSCULAR | Status: DC | PRN

## 2021-11-23 MED ORDER — SODIUM CHLORIDE 0.9% FLUSH: 3.0000 mL | INTRAVENOUS | Status: DC | PRN

## 2021-11-23 MED ORDER — SODIUM CHLORIDE 0.9% FLUSH
3.0000 mL | Freq: Two times a day (BID) | INTRAVENOUS | Status: DC
  Administered 2021-11-24: 3 mL via INTRAVENOUS

## 2021-11-23 MED ORDER — TRAZODONE HCL 50 MG PO TABS
50.0000 mg | ORAL_TABLET | Freq: Every day | ORAL | Status: DC
  Administered 2021-11-23 – 2021-11-25 (×3): 50 mg via ORAL
  Filled 2021-11-23 (×3): qty 1

## 2021-11-23 MED ORDER — ACETAMINOPHEN 325 MG PO TABS: 650.0000 mg | ORAL_TABLET | Freq: Four times a day (QID) | ORAL | Status: DC | PRN

## 2021-11-23 NOTE — ED Triage Notes (Signed)
C/O left sided chest pain, weakness since Thursday. STates over the weekend pain worsened, worse with exertion.  HR also low all weekend.  C/O SOB/ DOE as well.

## 2021-11-23 NOTE — Assessment & Plan Note (Signed)
Not on anticoagulation Noted after he had a Holter monitor placed for 72 hours with longest episode lasting 53 minutes. Per chart review anticoagulation was discussed with patient but he was reluctant to start due to an upcoming surgical procedure

## 2021-11-23 NOTE — Assessment & Plan Note (Signed)
Continue opioid therapy

## 2021-11-23 NOTE — ED Provider Notes (Signed)
[facility] Provider Note    Event Date/Time   First MD Initiated Contact with Patient 11/23/21 1025     (approximate)  History   Chief Complaint: Chest Pain  HPI  Zachary Clements is a 66 y.o. male with a past medical history of hypertension, gastric reflux, hypertrophic cardiomyopathy who presents to the emergency department for 3 days of dizziness weakness and lightheadedness.  According to the patient for the last 3 days he has felt dizzy and weak especially with any type of exertion or sitting upright.  Patient states this is happened before approximately a year ago when he was admitted to the ICU they discussed a pacemaker but ultimately his heart rate increased back to normal and he was discharged home.  Patient states he does continue to take metoprolol as prescribed.  Patient denies any chest pain currently but states he was having some slight chest pressure earlier.  Denies any recent illnesses fever cough or congestion.  No nausea or vomiting.  Physical Exam   Triage Vital Signs: ED Triage Vitals  Enc Vitals Group     BP 11/23/21 1012 126/78     Pulse Rate 11/23/21 1012 (!) 43     Resp 11/23/21 1012 16     Temp 11/23/21 1012 98.2 F (36.8 C)     Temp Source 11/23/21 1012 Oral     SpO2 11/23/21 1012 97 %     Weight 11/23/21 1012 192 lb 3.9 oz (87.2 kg)     Height 11/23/21 1012 '5\' 10"'$  (1.778 m)     Head Circumference --      Peak Flow --      Pain Score 11/23/21 1011 4     Pain Loc --      Pain Edu? --      Excl. in [place]? --     Most recent vital signs: Vitals:   11/23/21 1012  BP: 126/78  Pulse: (!) 43  Resp: 16  Temp: 98.2 F (36.8 C)  SpO2: 97%    General: Awake, no distress.  CV:  Good peripheral perfusion.  Regular rhythm rate around 40 bpm. Resp:  Normal effort.  Equal breath sounds bilaterally.  Abd:  No distention.  Soft, nontender.  No rebound or guarding.   ED Results / Procedures / Treatments   EKG  EKG timed  10:29:33 viewed and interpreted by myself appear to show a complete heart block.  Patient has a slightly widened QRS with left axis deviation he has anterior T wave inversions in 1 and aVL.  No ST elevation.  RADIOLOGY  I have reviewed and interpreted the chest x-ray images no obvious consolidation on my evaluation. Radiology has read the chest x-ray is mild cardiomegaly with venous hypertension.   MEDICATIONS ORDERED IN ED: Medications - No data to display   IMPRESSION / MDM / [place] / ED COURSE  I reviewed the triage vital signs and the nursing notes.  Patient's presentation is most consistent with acute presentation with potential threat to life or bodily function.  Patient presents to the emergency department for 3 days of weakness dizziness and lightheadedness intermittent chest discomfort as well.  Patient found to be bradycardic around 40 bpm.  On repeat EKG obtained appears to have a complete heart block with AV disassociation.  Patient follows up with Dr. Saralyn Pilar of cardiology.  We will check labs including cardiac enzymes electrolytes.  We will continue to closely monitor in the emergency department.  Once  labs have resulted we will discuss with Dr. Saralyn Pilar.  Anticipate the patient will require admission to the hospital for metoprolol washout and consideration of possible pacemaker.  History of hypertrophic cardiomyopathy may complicate the patient's treatment course as he would benefit from beta-blocking but this also might be contributing to his heart block.  Patient's labs have resulted large within normal limits.  CBC shows no significant finding, chemistry is reassuring and troponin negative.  I spoke to Dr. Saralyn Pilar of cardiology.  We will admit the patient to the hospitalist service discontinue metoprolol for now and he will see the patient in consultation.  FINAL CLINICAL IMPRESSION(S) / ED DIAGNOSES   Complete heart block   Note:  This document was  prepared using Dragon voice recognition software and may include unintentional dictation errors.   Harvest Dark, MD 11/23/21 1149

## 2021-11-23 NOTE — ED Notes (Signed)
Lovenox refused as pt reports the last time he received it, his heart rate dropped into the 20s.   Pt taken to room 243A by Stefani Dama.  Spouse along side pt.

## 2021-11-23 NOTE — Consult Note (Signed)
[facility] Cardiology  CARDIOLOGY CONSULT NOTE  Patient ID: Zachary Clements MRN: [id number] DOB/AGE: [birth year] 66 y.o.  Admit date: 11/23/2021 Referring Physician [place] Primary Physician [facility] Primary Cardiologist Ayven Pheasant Reason for Consultation complete heart block  HPI: 66 year old gentleman referred for evaluation of complete heart block.  The patient has a history of hypertrophic cardiomyopathy, essential hypertension, and history of transient high-grade AV block.  The patient was admitted 10/19/2019 with bradycardia, noted to have high-grade AV block, which resolved after holding verapamil and metoprolol succinate.  Patient resumed low-dose metoprolol succinate and has done well until 2 to 3 days ago he started to experience this of breath and lightheadedness.  He presents to [facility] ED where ECG revealed degree AV block with rates in the upper 30s and low 40s.  Patient remains hemodynamically stable and asymptomatic when laying in bed.  Patient has history of hypertrophic cardiomyopathy.  2D echocardiogram 04/14/2017 revealed LVEF grade 55% with septal wall thickness of 0.9 cm.  Cardiac MRI 04/04/2020 revealed LVEF 69%, septal wall thickness 2.3 cm which is consistent with hypertrophic cardiomyopathy.  The patient has undergone previous cardiac catheterization 10/19/2019 which revealed insignificant coronary artery disease.  Review of systems complete and found to be negative unless listed above     Past Medical History:  Diagnosis Date   Arthritis    Dysrhythmia    GERD (gastroesophageal reflux disease)    Hypertension    Hypertrophic cardiomyopathy ([place])    Insomnia     Past Surgical History:  Procedure Laterality Date   BACK SURGERY     rods in back   BACK SURGERY     removal of rods in back   COLONOSCOPY WITH PROPOFOL N/A 03/05/2020   Procedure: COLONOSCOPY WITH PROPOFOL;  Surgeon: Toledo, Benay Pike, MD;  Location: [facility] ENDOSCOPY;  Service: Gastroenterology;  Laterality: N/A;    colonscopy     KNEE ARTHROSCOPY Right    LEFT HEART CATH AND CORONARY ANGIOGRAPHY N/A 10/19/2019   Procedure: LEFT HEART CATH AND CORONARY ANGIOGRAPHY;  Surgeon: Nelva Bush, MD;  Location: [place] CV LAB;  Service: Cardiovascular;  Laterality: N/A;   LUMBAR LAMINECTOMY/DECOMPRESSION MICRODISCECTOMY N/A 11/09/2017   Procedure: LUMBAR LAMINECTOMY/DECOMPRESSION MICRODISCECTOMY 1 LEVEL-4-5;  Surgeon: Meade Maw, MD;  Location: [facility] ORS;  Service: Neurosurgery;  Laterality: N/A;   TEMPORARY PACEMAKER N/A 10/19/2019   Procedure: TEMPORARY PACEMAKER;  Surgeon: Nelva Bush, MD;  Location: [place] CV LAB;  Service: Cardiovascular;  Laterality: N/A;    (Not in a hospital admission)  Social History   Socioeconomic History   Marital status: Married    Spouse name: Not on file   Number of children: Not on file   Years of education: Not on file   Highest education level: Not on file  Occupational History   Not on file  Tobacco Use   Smoking status: Former    Types: Cigarettes    Quit date: 11/04/1998    Years since quitting: 23.0   Smokeless tobacco: Never  Vaping Use   Vaping Use: Never used  Substance and Sexual Activity   Alcohol use: Not Currently   Drug use: Not on file    Comment: cbd oil   Sexual activity: Not on file  Other Topics Concern   Not on file  Social History Narrative   Not on file   Social Determinants of Health   Financial Resource Strain: Not on file  Food Insecurity: Not on file  Transportation Needs: Not on file  Physical Activity: Not on  file  Stress: Not on file  Social Connections: Not on file  Intimate Partner Violence: Not on file    No family history on file.    Review of systems complete and found to be negative unless listed above      PHYSICAL EXAM  General: Well developed, well nourished, in no acute distress HEENT:  Normocephalic and atramatic Neck:  No JVD.  Lungs: Clear bilaterally to auscultation and  percussion. Heart: HRRR . Normal S1 and S2 without gallops or murmurs.  Abdomen: Bowel sounds are positive, abdomen soft and non-tender  Msk:  Back normal, normal gait. Normal strength and tone for age. Extremities: No clubbing, cyanosis or edema.   Neuro: Alert and oriented X 3. Psych:  Good affect, responds appropriately  Labs:   Lab Results  Component Value Date   WBC 8.8 11/23/2021   HGB 14.3 11/23/2021   HCT 44.1 11/23/2021   MCV 96.7 11/23/2021   PLT 257 11/23/2021    Recent Labs  Lab 11/23/21 1034  NA 140  K 3.8  CL 109  CO2 23  BUN 17  CREATININE 1.19  CALCIUM 8.9  PROT 6.2*  BILITOT 0.7  ALKPHOS 42  ALT 24  AST 26  GLUCOSE 149*   No results found for: "CKTOTAL", "CKMB", "CKMBINDEX", "TROPONINI" No results found for: "CHOL" No results found for: "HDL" No results found for: "LDLCALC" No results found for: "TRIG" No results found for: "CHOLHDL" No results found for: "LDLDIRECT"    Radiology: DG Chest Portable 1 View  Result Date: 11/23/2021 CLINICAL DATA:  Shortness of breath and left-sided chest pain. EXAM: PORTABLE CHEST 1 VIEW COMPARISON:  AP chest 10/19/2019 FINDINGS: Cardiac silhouette is again mildly enlarged. Mild calcification within the aortic arch. There is cephalization of the pulmonary vasculature as can be seen with pulmonary venous hypertension. Unchanged mild bilateral lower lung interstitial thickening. No new focal airspace opacity. No pleural effusion pneumothorax. Moderate multilevel degenerative disc changes of the thoracic spine. IMPRESSION: 1. Mild cardiomegaly and pulmonary venous hypertension. 2. No radiographic evidence of pneumonia. Electronically Signed   By: Yvonne Kendall M.D.   On: 11/23/2021 11:29    EKG: Complete heart block at 42 bpm  ASSESSMENT AND PLAN:   1.  Complete heart block, ventricular escape rate at 42 bpm, hemodynamically stable, with history of high-grade AV block 2.  Hypertrophic cardiomyopathy, minimally  symptomatic 3.  Insignificant coronary artery disease by cardiac catheterization 10/19/2019  Recommendations  1.  Agree with current therapy 2.  Hold metoprolol succinate 3.  Discussed the risk, benefits alternatives of additional dual-chamber pacemaker versus Micra AV leadless pacemaker and have agreed to proceed with leadless pacemaker implantation, scheduled for//2023.  Signed: Isaias Cowman MD,PhD, [facility] 11/23/2021, 1:34 PM

## 2021-11-23 NOTE — Assessment & Plan Note (Signed)
Symptomatic with complaints of exertional shortness of breath, chest pain and increased fatigue. Cardiology consult

## 2021-11-23 NOTE — Assessment & Plan Note (Signed)
Patient presents to the ER for evaluation of a 3-day history of dizziness, lightheadedness and fatigue and noted to be bradycardic with heart rate in the 30s and 40s. History of complete heart block in 2021 that resolved following discontinuation of verapamil and metoprolol. Patient is currently on metoprolol and timolol which will be placed on hold Patient is normotensive we will start him on vasopressors if he becomes hypotensive External pacer pads Atropine as needed Cardiology consult

## 2021-11-23 NOTE — H&P (Signed)
History and Physical    Patient: Zachary Clements PYK:[id number] DOB: 03-Dec-1955 DOA: 11/23/2021 DOS: the patient was seen and examined on 11/23/2021 PCP: Derinda Late, MD  Patient coming from: Home  Chief Complaint:  Chief Complaint  Patient presents with   Chest Pain   HPI: Zachary Clements is a 66 y.o. male with medical history significant for hypertrophic cardiomyopathy, hypertension, GERD, arthritis, chronic low back pain who presents to the ER for evaluation of a 3-day history of worsening shortness of breath with exertion, dizziness, lightheadedness and chest pressure which he describes as feeling like someone is sitting on his chest. He denies having any falls or loss of consciousness but notes that he sits down quickly every time he feels dizzy or lightheaded. He denies having any leg swelling, orthopnea, nausea, no vomiting, no palpitations, no diaphoresis, no headache, no fever, no chills, no urinary symptoms, no changes in his bowel habits, no blurred vision no focal deficit. Patient was hospitalized for same in 08/21 and at that time was found to be bradycardic with high-grade AV block and periods of third-degree AV block with heart rate as low as the 20s.  He was said to be on verapamil and metoprolol at that time which were discontinued and he was placed on dopamine with improvement of his heart rate and blood pressure.  He underwent a cardiac cath which revealed mild, nonobstructive coronary artery disease and a normal LVEF of 60 to 65% with no wall motion abnormalities and with moderate concentric LVH. Chart review shows he is on metoprolol at 12.5 mg He will be referred to the vision status for further evaluation   Review of Systems: As mentioned in the history of present illness. All other systems reviewed and are negative. Past Medical History:  Diagnosis Date   Arthritis    Dysrhythmia    GERD (gastroesophageal reflux disease)    Hypertension    Hypertrophic  cardiomyopathy ([place])    Insomnia    Past Surgical History:  Procedure Laterality Date   BACK SURGERY     rods in back   BACK SURGERY     removal of rods in back   COLONOSCOPY WITH PROPOFOL N/A 03/05/2020   Procedure: COLONOSCOPY WITH PROPOFOL;  Surgeon: Toledo, Benay Pike, MD;  Location: [facility] ENDOSCOPY;  Service: Gastroenterology;  Laterality: N/A;   colonscopy     KNEE ARTHROSCOPY Right    LEFT HEART CATH AND CORONARY ANGIOGRAPHY N/A 10/19/2019   Procedure: LEFT HEART CATH AND CORONARY ANGIOGRAPHY;  Surgeon: Nelva Bush, MD;  Location: [place] CV LAB;  Service: Cardiovascular;  Laterality: N/A;   LUMBAR LAMINECTOMY/DECOMPRESSION MICRODISCECTOMY N/A 11/09/2017   Procedure: LUMBAR LAMINECTOMY/DECOMPRESSION MICRODISCECTOMY 1 LEVEL-4-5;  Surgeon: Meade Maw, MD;  Location: [facility] ORS;  Service: Neurosurgery;  Laterality: N/A;   TEMPORARY PACEMAKER N/A 10/19/2019   Procedure: TEMPORARY PACEMAKER;  Surgeon: Nelva Bush, MD;  Location: [place] [facility] LAB;  Service: Cardiovascular;  Laterality: N/A;   Social History:  reports that he quit smoking about 23 years ago. His smoking use included cigarettes. He has never used smokeless tobacco. He reports that he does not currently use alcohol. No history on file for drug use.  No Known Allergies  No family history on file.  Prior to Admission medications   Medication Sig Start Date End Date Taking? Authorizing Provider  atorvastatin (LIPITOR) 10 MG tablet Take 10 mg by mouth daily with supper.    [provider]  DULoxetine (CYMBALTA) 60 MG capsule Take 120 mg by  mouth daily.    [provider]  gabapentin (NEURONTIN) 300 MG capsule Take 300 mg by mouth in the morning, at noon, and at bedtime. 10/01/19   [provider]  HYDROcodone-acetaminophen (NORCO) 10-325 MG tablet Take 1 tablet by mouth 2 (two) times daily as needed. 04/30/20   [provider]  metoprolol succinate (TOPROL-XL) 25 MG 24 hr  tablet TAKE 1/2 TABLET(12.5 MG) BY MOUTH EVERY DAY 05/26/20   [provider]  Multiple Vitamin (MULTIVITAMIN WITH MINERALS) TABS tablet Take 1 tablet by mouth daily.    [provider]  timolol (TIMOPTIC) 0.5 % ophthalmic solution Place 1 drop into both eyes daily.  10/17/19   [provider]  traZODone (DESYREL) 50 MG tablet Take 1 tablet by mouth at bedtime. 08/05/19   [provider]    Physical Exam: Vitals:   11/23/21 1030 11/23/21 1100 11/23/21 1130 11/23/21 1200  BP: 113/70 107/71 108/67 (!) 105/57  Pulse: (!) 41 (!) 43 (!) 43 (!) 39  Resp: '13 14 11 15  '$ Temp:      TempSrc:      SpO2: 97% 97% 97% 94%  Weight:      Height:       Physical Exam Vitals and nursing note reviewed.  Constitutional:      Appearance: He is well-developed.  HENT:     Head: Normocephalic and atraumatic.  Cardiovascular:     Rate and Rhythm: Bradycardia present.     Heart sounds: Normal heart sounds.  Pulmonary:     Effort: Pulmonary effort is normal.  Abdominal:     General: Bowel sounds are normal.     Palpations: Abdomen is soft.  Musculoskeletal:        General: Normal range of motion.     Cervical back: Normal range of motion and neck supple.  Skin:    General: Skin is warm and dry.  Neurological:     General: No focal deficit present.     Mental Status: He is alert.  Psychiatric:        Mood and Affect: Mood normal.        Behavior: Behavior normal.     Data Reviewed: Relevant notes from primary care and specialist visits, past discharge summaries as available in EHR, including Care Everywhere. Prior diagnostic testing as pertinent to current admission diagnoses Updated medications and problem lists for reconciliation ED course, including vitals, labs, imaging, treatment and response to treatment Triage notes, nursing and pharmacy notes and ED provider's notes Notable results as noted in HPI Labs reviewed.  Sodium 140, potassium 3.1, chloride 109,  bicarb 23, glucose 149, BUN 17, creatinine 1.19, calcium 8.9, total protein 6.2, albumin 3.8, AST 26, ALT 24, alkaline phosphatase 42, total bilirubin 0.7, troponin 14, TSH 3.8, white count 8.8, hemoglobin 14.3, hematocrit 44.1, platelet count 257 Chest x-ray reviewed by me shows mild cardiomegaly and pulmonary venous hypertension. No radiographic evidence of pneumonia. Twelve-lead EKG shows a complete heart block There are no new results to review at this time.  Assessment and Plan: * Symptomatic bradycardia Patient presents to the ER for evaluation of a 3-day history of dizziness, lightheadedness and fatigue and noted to be bradycardic with heart rate in the 30s and 40s. History of complete heart block in 2021 that resolved following discontinuation of verapamil and metoprolol. Patient is currently on metoprolol and timolol which will be placed on hold Patient is normotensive we will start him on vasopressors if he becomes hypotensive External pacer  pads Atropine as needed Cardiology consult  Paroxysmal atrial fibrillation ([place]) Not on anticoagulation Noted after he had a Holter monitor placed for 72 hours with longest episode lasting 53 minutes. Per chart review anticoagulation was discussed with patient but he was reluctant to start due to an upcoming surgical procedure  Hypertrophic obstructive cardiomyopathy (HOCM) (H[facility]) Symptomatic with complaints of exertional shortness of breath, chest pain and increased fatigue. Cardiology consult  Chronic bilateral low back pain with bilateral sciatica Continue opioid therapy      Advance Care Planning:   Code Status: Full Code   Consults: Cardiology  Family Communication: Greater than 50% of time was spent discussing patient's condition and plan of care with him at the bedside.  All questions and concerns have been addressed.  He verbalizes understanding and agrees with the plan.  Severity of Illness: The appropriate patient status  for this patient is OBSERVATION. Observation status is judged to be reasonable and necessary in order to provide the required intensity of service to ensure the patient's safety. The patient's presenting symptoms, physical exam findings, and initial radiographic and laboratory data in the context of their medical condition is felt to place them at decreased risk for further clinical deterioration. Furthermore, it is anticipated that the patient will be medically stable for discharge from the hospital within 2 midnights of admission.   Author: Collier Bullock, MD 11/23/2021 12:49 PM  For on call review [email or web address].

## 2021-11-24 ENCOUNTER — Observation Stay
Admit: 2021-11-24 | Discharge: 2021-11-24 | Disposition: A | Discharge status: Home / Self Care | Payer: Medicare Other | Attending: Internal Medicine | Admitting: Internal Medicine

## 2021-11-24 DIAGNOSIS — R001 Bradycardia, unspecified: Secondary | ICD-10-CM | POA: Diagnosis not present

## 2021-11-24 DIAGNOSIS — Z87891 Personal history of nicotine dependence: Secondary | ICD-10-CM | POA: Diagnosis not present

## 2021-11-24 DIAGNOSIS — G4700 Insomnia, unspecified: Secondary | ICD-10-CM | POA: Diagnosis present

## 2021-11-24 DIAGNOSIS — I10 Essential (primary) hypertension: Secondary | ICD-10-CM | POA: Diagnosis present

## 2021-11-24 DIAGNOSIS — I421 Obstructive hypertrophic cardiomyopathy: Secondary | ICD-10-CM | POA: Diagnosis present

## 2021-11-24 DIAGNOSIS — M5441 Lumbago with sciatica, right side: Secondary | ICD-10-CM | POA: Diagnosis present

## 2021-11-24 DIAGNOSIS — I422 Other hypertrophic cardiomyopathy: Secondary | ICD-10-CM | POA: Diagnosis present

## 2021-11-24 DIAGNOSIS — M5442 Lumbago with sciatica, left side: Secondary | ICD-10-CM | POA: Diagnosis present

## 2021-11-24 DIAGNOSIS — G8929 Other chronic pain: Secondary | ICD-10-CM | POA: Diagnosis present

## 2021-11-24 DIAGNOSIS — I442 Atrioventricular block, complete: Secondary | ICD-10-CM | POA: Diagnosis present

## 2021-11-24 DIAGNOSIS — K219 Gastro-esophageal reflux disease without esophagitis: Secondary | ICD-10-CM | POA: Diagnosis present

## 2021-11-24 DIAGNOSIS — Z79899 Other long term (current) drug therapy: Secondary | ICD-10-CM | POA: Diagnosis not present

## 2021-11-24 DIAGNOSIS — I480 Paroxysmal atrial fibrillation: Secondary | ICD-10-CM | POA: Diagnosis present

## 2021-11-24 DIAGNOSIS — I2510 Atherosclerotic heart disease of native coronary artery without angina pectoris: Secondary | ICD-10-CM | POA: Diagnosis present

## 2021-11-24 LAB — BASIC METABOLIC PANEL
Anion gap: 2 — ABNORMAL LOW (ref 5–15)
BUN: 14 mg/dL (ref 8–23)
CO2: 26 mmol/L (ref 22–32)
Calcium: 8.5 mg/dL — ABNORMAL LOW (ref 8.9–10.3)
Chloride: 112 mmol/L — ABNORMAL HIGH (ref 98–111)
Creatinine, Ser: 0.96 mg/dL (ref 0.61–1.24)
GFR, Estimated: >60 mL/min (ref >60)
Glucose, Bld: 103 mg/dL — ABNORMAL HIGH (ref 70–99)
Potassium: 4.2 mmol/L (ref 3.5–5.1)
Sodium: 140 mmol/L (ref 135–145)

## 2021-11-24 LAB — CBC
HCT: 40.4 % (ref 39.0–52.0)
Hemoglobin: 13.2 g/dL (ref 13.0–17.0)
MCH: 31.5 pg (ref 26.0–34.0)
MCHC: 32.7 g/dL (ref 30.0–36.0)
MCV: 96.4 fL (ref 80.0–100.0)
Platelets: 223 10*3/uL (ref 150–400)
RBC: 4.19 MIL/uL — ABNORMAL LOW (ref 4.22–5.81)
RDW: 12.9 % (ref 11.5–15.5)
WBC: 5.9 10*3/uL (ref 4.0–10.5)
nRBC: 0 % (ref 0.0–0.2)

## 2021-11-24 LAB — ECHOCARDIOGRAM COMPLETE
AR max vel: 2.33 cm2
AV Area VTI: 2.76 cm2
AV Area mean vel: 2.74 cm2
AV Mean grad: 14 mmHg
AV Peak grad: 28.5 mmHg
Ao pk vel: 2.67 m/s
Area-P 1/2: 4.74 cm2
Height: 70 in
S' Lateral: 2.9 cm
Weight: 3075.86 oz

## 2021-11-24 NOTE — Progress Notes (Signed)
[facility] Cardiology  SUBJECTIVE: Patient laying in bed, denies chest pain or shortness of breath   Vitals:   11/23/21 2240 11/23/21 2255 11/24/21 0100 11/24/21 0733  BP: 133/77 139/76 128/70 (!) 100/48  Pulse: (!) 48 (!) 39  (!) 34  Resp: '16 18 13   '$ Temp: 98 F (36.7 C) 97.8 F (36.6 C) 97.8 F (36.6 C) 97.7 F (36.5 C)  TempSrc: Oral     SpO2: 100% 97% 98% 95%  Weight:      Height:         Intake/Output Summary (Last 24 hours) at 11/24/2021 2542 Last data filed at 11/24/2021 7062 Gross per 24 hour  Intake 240 ml  Output 1300 ml  Net -1060 ml      PHYSICAL EXAM  General: Well developed, well nourished, in no acute distress HEENT:  Normocephalic and atramatic Neck:  No JVD.  Lungs: Clear bilaterally to auscultation and percussion. Heart: HRRR . Normal S1 and S2 without gallops or murmurs.  Abdomen: Bowel sounds are positive, abdomen soft and non-tender  Msk:  Back normal, normal gait. Normal strength and tone for age. Extremities: No clubbing, cyanosis or edema.   Neuro: Alert and oriented X 3. Psych:  Good affect, responds appropriately   LABS: Basic Metabolic Panel: Recent Labs    11/23/21 1034 11/24/21 0522  NA 140 140  K 3.8 4.2  CL 109 112*  CO2 23 26  GLUCOSE 149* 103*  BUN 17 14  CREATININE 1.19 0.96  CALCIUM 8.9 8.5*   Liver Function Tests: Recent Labs    11/23/21 1034  AST 26  ALT 24  ALKPHOS 42  BILITOT 0.7  PROT 6.2*  ALBUMIN 3.8   No results for input(s): "LIPASE", "AMYLASE" in the last 72 hours. CBC: Recent Labs    11/23/21 1034 11/24/21 0522  WBC 8.8 5.9  HGB 14.3 13.2  HCT 44.1 40.4  MCV 96.7 96.4  PLT 257 223   Cardiac Enzymes: No results for input(s): "CKTOTAL", "CKMB", "CKMBINDEX", "TROPONINI" in the last 72 hours. BNP: Invalid input(s): "POCBNP" D-Dimer: No results for input(s): "DDIMER" in the last 72 hours. Hemoglobin A1C: No results for input(s): "HGBA1C" in the last 72 hours. Fasting Lipid Panel: No results for  input(s): "CHOL", "HDL", "LDLCALC", "TRIG", "CHOLHDL", "LDLDIRECT" in the last 72 hours. Thyroid Function Tests: Recent Labs    11/23/21 1034  TSH 3.863   Anemia Panel: No results for input(s): "VITAMINB12", "FOLATE", "FERRITIN", "TIBC", "IRON", "RETICCTPCT" in the last 72 hours.  DG Chest Portable 1 View  Result Date: 11/23/2021 CLINICAL DATA:  Shortness of breath and left-sided chest pain. EXAM: PORTABLE CHEST 1 VIEW COMPARISON:  AP chest 10/19/2019 FINDINGS: Cardiac silhouette is again mildly enlarged. Mild calcification within the aortic arch. There is cephalization of the pulmonary vasculature as can be seen with pulmonary venous hypertension. Unchanged mild bilateral lower lung interstitial thickening. No new focal airspace opacity. No pleural effusion pneumothorax. Moderate multilevel degenerative disc changes of the thoracic spine. IMPRESSION: 1. Mild cardiomegaly and pulmonary venous hypertension. 2. No radiographic evidence of pneumonia. Electronically Signed   By: Yvonne Kendall M.D.   On: 11/23/2021 11:29     Echo pending  TELEMETRY: Complete heart block 36 bpm:  ASSESSMENT AND PLAN:  Principal Problem:   Symptomatic bradycardia Active Problems:   Chronic bilateral low back pain with bilateral sciatica   Hypertrophic obstructive cardiomyopathy (HOCM) (HCC)   Paroxysmal atrial fibrillation ([place])    1. Complete heart block, ventricular escape rate at  42 bpm, hemodynamically stable, with history of high-grade AV block 2.  Hypertrophic cardiomyopathy, minimally symptomatic 3.  Insignificant coronary artery disease by cardiac catheterization 10/19/2019   Recommendations   1.  Agree with current therapy 2.  Hold metoprolol succinate 3.  Discussed the risk, benefits alternatives of additional dual-chamber pacemaker versus Micra AV leadless pacemaker and have agreed to proceed with leadless pacemaker implantation, scheduled for 11/25/2021   Isaias Cowman, MD, PhD,  [place] 11/24/2021 9:24 AM

## 2021-11-24 NOTE — Progress Notes (Signed)
Patient is not going for procedure today.  Per cardiology ok for patient to eat.  Diet order placed.

## 2021-11-24 NOTE — Progress Notes (Signed)
*  PRELIMINARY RESULTS* Echocardiogram 2D Echocardiogram has been performed.  Sherrie Sport 11/24/2021, 8:36 AM

## 2021-11-24 NOTE — Progress Notes (Signed)
PROGRESS NOTE    Zachary Clements  KCL:[id number] DOB: October 20, 1955 DOA: 11/23/2021 PCP: Derinda Late, MD    Brief Narrative:  Zachary Clements is a 66 y.o. male with medical history significant for hypertrophic cardiomyopathy, hypertension, GERD, arthritis, chronic low back pain who presents to the ER for evaluation of a 3-day history of worsening shortness of breath with exertion, dizziness, lightheadedness and chest pressure which he describes as feeling like someone is sitting on his chest.  Patient was hospitalized for same in 08/21 and at that time was found to be bradycardic with high-grade AV block and periods of third-degree AV block with heart rate as low as the 20s.  He was said to be on verapamil and metoprolol at that time which were discontinued and he was placed on dopamine with improvement of his heart rate and blood pressure.  He underwent a cardiac cath which revealed mild, nonobstructive coronary artery disease and a normal LVEF of 60 to 65% with no wall motion abnormalities and with moderate concentric LVH.    Consultants:  cardiology  Procedures:   Antimicrobials:      Subjective: No sob or cp. If sitting in bed he has no dizziness.   Objective: Vitals:   11/23/21 2240 11/23/21 2255 11/24/21 0100 11/24/21 0733  BP: 133/77 139/76 128/70 (!) 100/48  Pulse: (!) 48 (!) 39  (!) 34  Resp: '16 18 13   '$ Temp: 98 F (36.7 C) 97.8 F (36.6 C) 97.8 F (36.6 C) 97.7 F (36.5 C)  TempSrc: Oral     SpO2: 100% 97% 98% 95%  Weight:      Height:        Intake/Output Summary (Last 24 hours) at 11/24/2021 0825 Last data filed at 11/24/2021 0400 Gross per 24 hour  Intake --  Output 1300 ml  Net -1300 ml   Filed Weights   11/23/21 1012  Weight: 87.2 kg    Examination: Calm, NAD Cta no w/r Reg bradycardic s1/s2 no gallop Soft benign +bs No edema Aaoxox3  Mood and affect appropriate in current setting     Data Reviewed: I have personally reviewed following  labs and imaging studies  CBC: Recent Labs  Lab 11/23/21 1034 11/24/21 0522  WBC 8.8 5.9  HGB 14.3 13.2  HCT 44.1 40.4  MCV 96.7 96.4  PLT 257 494   Basic Metabolic Panel: Recent Labs  Lab 11/23/21 1034 11/24/21 0522  NA 140 140  K 3.8 4.2  CL 109 112*  CO2 23 26  GLUCOSE 149* 103*  BUN 17 14  CREATININE 1.19 0.96  CALCIUM 8.9 8.5*   GFR: Estimated Creatinine Clearance: 76 mL/min (by C-G formula based on SCr of 0.96 mg/dL). Liver Function Tests: Recent Labs  Lab 11/23/21 1034  AST 26  ALT 24  ALKPHOS 42  BILITOT 0.7  PROT 6.2*  ALBUMIN 3.8   No results for input(s): "LIPASE", "AMYLASE" in the last 168 hours. No results for input(s): "AMMONIA" in the last 168 hours. Coagulation Profile: No results for input(s): "INR", "PROTIME" in the last 168 hours. Cardiac Enzymes: No results for input(s): "CKTOTAL", "CKMB", "CKMBINDEX", "TROPONINI" in the last 168 hours. BNP (last 3 results) No results for input(s): "PROBNP" in the last 8760 hours. HbA1C: No results for input(s): "HGBA1C" in the last 72 hours. CBG: No results for input(s): "GLUCAP" in the last 168 hours. Lipid Profile: No results for input(s): "CHOL", "HDL", "LDLCALC", "TRIG", "CHOLHDL", "LDLDIRECT" in the last 72 hours. Thyroid Function Tests: Recent Labs  11/23/21 1034  TSH 3.863   Anemia Panel: No results for input(s): "VITAMINB12", "FOLATE", "FERRITIN", "TIBC", "IRON", "RETICCTPCT" in the last 72 hours. Sepsis Labs: No results for input(s): "PROCALCITON", "LATICACIDVEN" in the last 168 hours.  No results found for this or any previous visit (from the past 240 hour(s)).       Radiology Studies: DG Chest Portable 1 View  Result Date: 11/23/2021 CLINICAL DATA:  Shortness of breath and left-sided chest pain. EXAM: PORTABLE CHEST 1 VIEW COMPARISON:  AP chest 10/19/2019 FINDINGS: Cardiac silhouette is again mildly enlarged. Mild calcification within the aortic arch. There is cephalization of  the pulmonary vasculature as can be seen with pulmonary venous hypertension. Unchanged mild bilateral lower lung interstitial thickening. No new focal airspace opacity. No pleural effusion pneumothorax. Moderate multilevel degenerative disc changes of the thoracic spine. IMPRESSION: 1. Mild cardiomegaly and pulmonary venous hypertension. 2. No radiographic evidence of pneumonia. Electronically Signed   By: Yvonne Kendall M.D.   On: 11/23/2021 11:29        Scheduled Meds:  atorvastatin  10 mg Oral Q supper   DULoxetine  120 mg Oral Daily   enoxaparin (LOVENOX) injection  40 mg Subcutaneous Q24H   gabapentin  300 mg Oral TID   multivitamin with minerals  1 tablet Oral Daily   sodium chloride flush  3 mL Intravenous Q12H   traZODone  50 mg Oral QHS   Continuous Infusions:  sodium chloride     sodium chloride      Assessment & Plan:   Principal Problem:   Symptomatic bradycardia Active Problems:   Chronic bilateral low back pain with bilateral sciatica   Hypertrophic obstructive cardiomyopathy (HOCM) (HCC)   Paroxysmal atrial fibrillation (HCC)   Symptomatic bradycardia Patient presents to the ER for evaluation of a 3-day history of dizziness, lightheadedness and fatigue and noted to be bradycardic with heart rate in the 30s and 40s. History of complete heart block in 2021 that resolved following discontinuation of verapamil and metoprolol. Patient is currently on metoprolol and timolol which will be placed on hold 10/3 cardiology is input was appreciated plan for pacemaker placement in a.m. N.p.o. at midnight tonight   Paroxysmal atrial fibrillation ([place]) Not on anticoagulation Noted after he had a Holter monitor placed for 72 hours with longest episode lasting 53 minutes. Per chart review anticoagulation was discussed with patient but he was reluctant to start due to an upcoming surgical procedure 10/3 will have cardiology readdress this in the near future.   Hypertrophic  obstructive cardiomyopathy (HOCM) [facility]) Cardiology following. Will need to be on beta blocker. Once PPM placed, can resume.     Chronic bilateral low back pain with bilateral sciatica Continue opioid therapy        DVT prophylaxis: SCD Code Status: Full Family Communication: None at bedside Disposition Plan:  Status is: Observation The patient will require care spanning > 2 midnights and should be moved to inpatient because: needs  pacemaker placement        LOS: 0 days   Time spent: 35 minutes    Nolberto Hanlon, MD [facility] Pager [phone]  If 7PM-7AM, please contact night-coverage 11/24/2021, 8:25 AM

## 2021-11-25 ENCOUNTER — Encounter
Admission: EM | Disposition: A | Discharge status: Home / Self Care | Payer: Self-pay | Source: Home / Self Care | Attending: Osteopathic Medicine

## 2021-11-25 HISTORY — PX: PACEMAKER LEADLESS INSERTION: EP1219

## 2021-11-25 SURGERY — PACEMAKER LEADLESS INSERTION
Anesthesia: Moderate Sedation

## 2021-11-25 MED ORDER — SODIUM CHLORIDE 0.9 % IV SOLN: 250.0000 mL | INTRAVENOUS | Status: DC | PRN

## 2021-11-25 MED ORDER — HEPARIN (PORCINE) IN NACL 1000-0.9 UT/500ML-% IV SOLN
INTRAVENOUS | Status: AC
  Filled 2021-11-25: qty 1000

## 2021-11-25 MED ORDER — ONDANSETRON HCL 4 MG/2ML IJ SOLN: 4.0000 mg | Freq: Four times a day (QID) | INTRAMUSCULAR | Status: DC | PRN

## 2021-11-25 MED ORDER — LIDOCAINE HCL (PF) 1 % IJ SOLN
INTRAMUSCULAR | Status: DC | PRN
  Administered 2021-11-25: 18 mL
  Administered 2021-11-25: 2 mL

## 2021-11-25 MED ORDER — HEPARIN (PORCINE) IN NACL 1000-0.9 UT/500ML-% IV SOLN
INTRAVENOUS | Status: DC | PRN
  Administered 2021-11-25: 1000 mL

## 2021-11-25 MED ORDER — IOHEXOL 300 MG/ML  SOLN
INTRAMUSCULAR | Status: DC | PRN
  Administered 2021-11-25: 9 mL

## 2021-11-25 MED ORDER — SODIUM CHLORIDE 0.9% FLUSH
3.0000 mL | Freq: Two times a day (BID) | INTRAVENOUS | Status: DC
  Administered 2021-11-26: 3 mL via INTRAVENOUS

## 2021-11-25 MED ORDER — SODIUM CHLORIDE 0.9% FLUSH: 3.0000 mL | INTRAVENOUS | Status: DC | PRN

## 2021-11-25 MED ORDER — HEPARIN SODIUM (PORCINE) 1000 UNIT/ML IJ SOLN
INTRAMUSCULAR | Status: DC | PRN
  Administered 2021-11-25: 4500 [IU] via INTRAVENOUS

## 2021-11-25 MED ORDER — FENTANYL CITRATE (PF) 100 MCG/2ML IJ SOLN
INTRAMUSCULAR | Status: AC
  Filled 2021-11-25: qty 2

## 2021-11-25 MED ORDER — ACETAMINOPHEN 325 MG PO TABS: 650.0000 mg | ORAL_TABLET | ORAL | Status: DC | PRN

## 2021-11-25 MED ORDER — MIDAZOLAM HCL 2 MG/2ML IJ SOLN
INTRAMUSCULAR | Status: AC
  Filled 2021-11-25: qty 2

## 2021-11-25 MED ORDER — HEPARIN SODIUM (PORCINE) 1000 UNIT/ML IJ SOLN
INTRAMUSCULAR | Status: AC
  Filled 2021-11-25: qty 10

## 2021-11-25 MED ORDER — LIDOCAINE HCL 1 % IJ SOLN
INTRAMUSCULAR | Status: AC
  Filled 2021-11-25: qty 40

## 2021-11-25 MED ORDER — FENTANYL CITRATE (PF) 100 MCG/2ML IJ SOLN
INTRAMUSCULAR | Status: DC | PRN
  Administered 2021-11-25 (×2): 25 ug via INTRAVENOUS

## 2021-11-25 MED ORDER — MIDAZOLAM HCL 2 MG/2ML IJ SOLN
INTRAMUSCULAR | Status: DC | PRN
  Administered 2021-11-25 (×2): .5 mg via INTRAVENOUS

## 2021-11-25 SURGICAL SUPPLY — 20 items
CABLE ADAPT PACING TEMP 12FT (ADAPTER) IMPLANT
COVER EZ STRL 42X30 (DRAPES) IMPLANT
DILATOR VESSEL 38 20CM 12FR (INTRODUCER) IMPLANT
DILATOR VESSEL 38 20CM 14FR (INTRODUCER) IMPLANT
DILATOR VESSEL 38 20CM 18FR (INTRODUCER) IMPLANT
DILATOR VESSEL 38 20CM 8FR (INTRODUCER) IMPLANT
DRAPE BRACHIAL (DRAPES) IMPLANT
MICRA AV TRANSCATH PACING SYS (Pacemaker) ×1 IMPLANT
MICRA INTRODUCER SHEATH (SHEATH) ×1
NDL PERC 18GX7CM (NEEDLE) IMPLANT
NEEDLE PERC 18GX7CM (NEEDLE) ×1 IMPLANT
PACK CARDIAC CATH (CUSTOM PROCEDURE TRAY) ×1 IMPLANT
PAD ELECT DEFIB RADIOL ZOLL (MISCELLANEOUS) IMPLANT
SHEATH AVANTI 6FR X 11CM (SHEATH) IMPLANT
SHEATH AVANTI 7FRX11 (SHEATH) IMPLANT
SHEATH INTRODUCER MICRA (SHEATH) IMPLANT
SLEEVE REPOSITIONING LENGTH 30 (MISCELLANEOUS) IMPLANT
SYSTEM PACING TRNSCTH AV MICRA (Pacemaker) IMPLANT
WIRE AMPLATZ SS-J .035X180CM (WIRE) IMPLANT
WIRE PACING TEMP ST TIP 5 (CATHETERS) IMPLANT

## 2021-11-25 NOTE — Progress Notes (Signed)
Dr. Saralyn Pilar in at bedside, speaking with pt. And his wife Mateo Flow post leadless pacer implant.

## 2021-11-25 NOTE — Progress Notes (Signed)
PROGRESS NOTE    Zachary Clements   WER:[id number] DOB: 1956/01/04  DOA: 11/23/2021 Date of Service: 11/25/21 PCP: Derinda Late, MD     Brief Narrative / Hospital Course:  Zachary Clements is a 66 y.o. male with medical history significant for hypertrophic cardiomyopathy, hypertension, GERD, arthritis, chronic low back pain who presents to the ER for evaluation of a 3-day history of worsening shortness of breath with exertion, dizziness, lightheadedness and chest pressure which he describes as feeling like someone is sitting on his chest. Patient was hospitalized for same in 08/21 and at that time was found to be bradycardic with high-grade AV block and periods of third-degree AV block with heart rate as low as the 20s.  10/02: In ED, noted to be bradycardic with heart rate 30s/40s.  History of complete heart block, which resolved following discontinuation of verapamil and metoprolol.  Held metoprolol into mall.  Admitted to hospitalist service with cardiology to consult -pacemaker placement scheduled for 11/25/2021 10/03: No significant events. 10/04: Pacemaker placed, patient doing well after this  Consultants:  Cardiology  Procedures: 11/25/2021: pacemaker placement       ASSESSMENT & PLAN:   Principal Problem:   Symptomatic bradycardia Active Problems:   Chronic bilateral low back pain with bilateral sciatica   Hypertrophic obstructive cardiomyopathy (HOCM) (HCC)   Paroxysmal atrial fibrillation (HCC)   Symptomatic bradycardia Held home metoprolol and timolol  S/p pacemaker placement today 11/25/21  Atropine as needed Cardiology consult  Paroxysmal atrial fibrillation ([place]) Not on anticoagulation Noted after he had a Holter monitor placed for 72 hours with longest episode lasting 53 minutes. Per chart review anticoagulation was discussed with patient but he was reluctant to start due to an upcoming surgical procedure Will seek cardiology opinion re: anticoagulation  needed   Hypertrophic obstructive cardiomyopathy (HOCM) ([place]) Symptomatic with complaints of exertional shortness of breath, chest pain and increased fatigue. Cardiology following - resume beta blocker per their recommendations   Chronic bilateral low back pain with bilateral sciatica Continue opioid therapy    DVT prophylaxis: SCD Pertinent IV fluids/nutrition: Tolerating p.o., no continuous IV fluids Central lines / invasive devices: none  Code Status: FULL CODE Family Communication: Wife at bedside on rounds  Disposition: Inpatient TOC needs: None at this time Barriers to discharge / significant pending items: Pending cardiology clearance but anticipate if remains stable overnight can potentially discharge tomorrow 11/26/2021             Subjective:  Patient reports doing well after procedure, no complaints/concerns.  Denies chest pain, shortness of breath, dizziness/lightheadedness.       Objective:  Vitals:   11/25/21 1115 11/25/21 1130 11/25/21 1145 11/25/21 1253  BP: (!) 131/119 104/74 95/66 130/88  Pulse: (!) 49 69 (!) 41 72  Resp: '10 18 13 16  '$ Temp:    98.6 F (37 C)  TempSrc:    Oral  SpO2: 93% 93% 94% 95%  Weight:      Height:        Intake/Output Summary (Last 24 hours) at 11/25/2021 1411 Last data filed at 11/25/2021 1311 Gross per 24 hour  Intake 240 ml  Output 1400 ml  Net -1160 ml   Filed Weights   11/23/21 1012 11/25/21 0807  Weight: 87.2 kg 87.2 kg    Examination:  Constitutional:  VS as above General Appearance: alert, well-developed, well-nourished, NAD Neck: No masses, trachea midline Respiratory: Normal respiratory effort No rales Cardiovascular: S1/S2 normal No rub/gallop auscultated No lower extremity edema Musculoskeletal:  Symmetrical movement in all extremities Neurological: No cranial nerve deficit on limited exam Alert Psychiatric: Normal judgment/insight Normal mood and affect       Scheduled  Medications:   atorvastatin  10 mg Oral Q supper   DULoxetine  120 mg Oral Daily   enoxaparin (LOVENOX) injection  40 mg Subcutaneous Q24H   gabapentin  300 mg Oral TID   multivitamin with minerals  1 tablet Oral Daily   sodium chloride flush  3 mL Intravenous Q12H   traZODone  50 mg Oral QHS    Continuous Infusions:  sodium chloride      PRN Medications:  sodium chloride, acetaminophen **OR** acetaminophen, acetaminophen, HYDROcodone-acetaminophen, ondansetron **OR** ondansetron (ZOFRAN) IV, ondansetron (ZOFRAN) IV, sodium chloride flush  Antimicrobials:  Anti-infectives (From admission, onward)    None       Data Reviewed: I have personally reviewed following labs and imaging studies  CBC: Recent Labs  Lab 11/23/21 1034 11/24/21 0522  WBC 8.8 5.9  HGB 14.3 13.2  HCT 44.1 40.4  MCV 96.7 96.4  PLT 257 202   Basic Metabolic Panel: Recent Labs  Lab 11/23/21 1034 11/24/21 0522  NA 140 140  K 3.8 4.2  CL 109 112*  CO2 23 26  GLUCOSE 149* 103*  BUN 17 14  CREATININE 1.19 0.96  CALCIUM 8.9 8.5*   GFR: Estimated Creatinine Clearance: 76 mL/min (by C-G formula based on SCr of 0.96 mg/dL). Liver Function Tests: Recent Labs  Lab 11/23/21 1034  AST 26  ALT 24  ALKPHOS 42  BILITOT 0.7  PROT 6.2*  ALBUMIN 3.8   No results for input(s): "LIPASE", "AMYLASE" in the last 168 hours. No results for input(s): "AMMONIA" in the last 168 hours. Coagulation Profile: No results for input(s): "INR", "PROTIME" in the last 168 hours. Cardiac Enzymes: No results for input(s): "CKTOTAL", "CKMB", "CKMBINDEX", "TROPONINI" in the last 168 hours. BNP (last 3 results) No results for input(s): "PROBNP" in the last 8760 hours. HbA1C: No results for input(s): "HGBA1C" in the last 72 hours. CBG: No results for input(s): "GLUCAP" in the last 168 hours. Lipid Profile: No results for input(s): "CHOL", "HDL", "LDLCALC", "TRIG", "CHOLHDL", "LDLDIRECT" in the last 72 hours. Thyroid  Function Tests: Recent Labs    11/23/21 1034  TSH 3.863   Anemia Panel: No results for input(s): "VITAMINB12", "FOLATE", "FERRITIN", "TIBC", "IRON", "RETICCTPCT" in the last 72 hours. Urine analysis:    Component Value Date/Time   COLORURINE AMBER (A) 11/03/2017 1106   APPEARANCEUR HAZY (A) 11/03/2017 1106   LABSPEC 1.021 11/03/2017 1106   PHURINE 5.0 11/03/2017 1106   GLUCOSEU NEGATIVE 11/03/2017 1106   HGBUR NEGATIVE 11/03/2017 1106   BILIRUBINUR NEGATIVE 11/03/2017 1106   KETONESUR NEGATIVE 11/03/2017 1106   PROTEINUR 30 (A) 11/03/2017 1106   NITRITE NEGATIVE 11/03/2017 1106   LEUKOCYTESUR NEGATIVE 11/03/2017 1106   Sepsis Labs: '@LABRCNTIP'$ (procalcitonin:4,lacticidven:4)  No results found for this or any previous visit (from the past 240 hour(s)).       Radiology Studies: EP PPM/ICD IMPLANT  Result Date: 11/25/2021 Successful Micra AV leadless pacemaker implantation   ECHOCARDIOGRAM COMPLETE  Result Date: 11/24/2021    ECHOCARDIOGRAM REPORT   Patient Name:   BAYARD MORE Date of Exam: 11/24/2021 Medical Rec #:  [id number]       Height:       70.0 in Accession #:    [id number]      Weight:       192.2 lb Date of Birth:  May 24, 1955  BSA:          2.053 m Patient Age:    29 years        BP:           128/70 mmHg Patient Gender: M               HR:           35 bpm. Exam Location:  [facility] Procedure: 2D Echo, Cardiac Doppler and Color Doppler Indications:     Hypertrophic obstructive cardiomyopathy I42.1  History:         Patient has prior history of Echocardiogram examinations, most                  recent 10/20/2019. Risk Factors:Hypertension. Hypertrophic                  obstructive cardiomyopathy.  Sonographer:     Sherrie Sport Referring Phys:  [id number] AGBATA Diagnosing Phys: Isaias Cowman MD IMPRESSIONS  1. Left ventricular ejection fraction, by estimation, is 60 to 65%. The left ventricle has normal function. The left ventricle has no regional wall motion  abnormalities. There is moderate asymmetric left ventricular hypertrophy of the septal segment. Left ventricular diastolic parameters were normal.  2. Right ventricular systolic function is normal. The right ventricular size is normal.  3. The mitral valve is normal in structure. Mild to moderate mitral valve regurgitation. No evidence of mitral stenosis.  4. Tricuspid valve regurgitation is mild to moderate.  5. The aortic valve is normal in structure. Aortic valve regurgitation is not visualized. No aortic stenosis is present.  6. The inferior vena cava is normal in size with greater than 50% respiratory variability, suggesting right atrial pressure of 3 mmHg. FINDINGS  Left Ventricle: Left ventricular ejection fraction, by estimation, is 60 to 65%. The left ventricle has normal function. The left ventricle has no regional wall motion abnormalities. The left ventricular internal cavity size was normal in size. There is  moderate asymmetric left ventricular hypertrophy of the septal segment. Left ventricular diastolic parameters were normal. Right Ventricle: The right ventricular size is normal. No increase in right ventricular wall thickness. Right ventricular systolic function is normal. Left Atrium: Left atrial size was normal in size. Right Atrium: Right atrial size was normal in size. Pericardium: There is no evidence of pericardial effusion. Mitral Valve: The mitral valve is normal in structure. Mild to moderate mitral valve regurgitation. No evidence of mitral valve stenosis. Tricuspid Valve: The tricuspid valve is normal in structure. Tricuspid valve regurgitation is mild to moderate. No evidence of tricuspid stenosis. Aortic Valve: The aortic valve is normal in structure. Aortic valve regurgitation is not visualized. No aortic stenosis is present. Aortic valve mean gradient measures 14.0 mmHg. Aortic valve peak gradient measures 28.5 mmHg. Aortic valve area, by VTI measures 2.76 cm. Pulmonic Valve: The  pulmonic valve was normal in structure. Pulmonic valve regurgitation is not visualized. No evidence of pulmonic stenosis. Aorta: The aortic root is normal in size and structure. Venous: The inferior vena cava is normal in size with greater than 50% respiratory variability, suggesting right atrial pressure of 3 mmHg. IAS/Shunts: No atrial level shunt detected by color flow Doppler.  LEFT VENTRICLE PLAX 2D LVIDd:         4.20 cm   Diastology LVIDs:         2.90 cm   LV e' medial:    15.20 cm/s LV PW:  1.00 cm   LV E/e' medial:  9.7 LV IVS:        1.10 cm   LV e' lateral:   12.40 cm/s LVOT diam:     2.20 cm   LV E/e' lateral: 11.9 LV SV:         152 LV SV Index:   74 LVOT Area:     3.80 cm  RIGHT VENTRICLE RV Basal diam:  3.10 cm RV Mid diam:    3.50 cm RV S prime:     11.19 cm/s TAPSE (M-mode): 3.1 cm LEFT ATRIUM              Index        RIGHT ATRIUM          Index LA diam:        4.80 cm  2.34 cm/m   RA Area:     9.45 cm LA Vol (A2C):   139.0 ml 67.72 ml/m  RA Volume:   16.20 ml 7.89 ml/m LA Vol (A4C):   129.0 ml 62.85 ml/m LA Biplane Vol: 136.0 ml 66.26 ml/m  AORTIC VALVE AV Area (Vmax):    2.33 cm AV Area (Vmean):   2.74 cm AV Area (VTI):     2.76 cm AV Vmax:           267.00 cm/s AV Vmean:          169.500 cm/s AV VTI:            0.552 m AV Peak Grad:      28.5 mmHg AV Mean Grad:      14.0 mmHg LVOT Vmax:         164.00 cm/s LVOT Vmean:        122.000 cm/s LVOT VTI:          0.401 m LVOT/AV VTI ratio: 0.73  AORTA Ao Root diam: 3.35 cm MITRAL VALVE                TRICUSPID VALVE MV Area (PHT): 4.74 cm     TR Peak grad:   38.7 mmHg MV Decel Time: 160 msec     TR Vmax:        311.00 cm/s MV E velocity: 147.00 cm/s MV A velocity: 56.60 cm/s   SHUNTS MV E/A ratio:  2.60         Systemic VTI:  0.40 m                             Systemic Diam: 2.20 cm Isaias Cowman MD Electronically signed by Isaias Cowman MD Signature Date/Time: 11/24/2021/12:53:55 PM    Final             LOS: 1  day      Emeterio Reeve, DO [facility] 11/25/2021, 2:11 PM   Staff may message me via secure chat in [place]  but this may not receive immediate response,  please page for urgent matters!  If 7PM-7AM, please contact night-coverage [email or web address]  Dictation software was used to generate the above note. Typos may occur and escape review, as with typed/written notes. Please contact Dr Sheppard Coil directly for clarity if needed.

## 2021-11-25 NOTE — Hospital Course (Signed)
Zachary Clements is a 66 y.o. male with medical history significant for hypertrophic cardiomyopathy, hypertension, GERD, arthritis, chronic low back pain who presents to the ER for evaluation of a 3-day history of worsening shortness of breath with exertion, dizziness, lightheadedness and chest pressure which he describes as feeling like someone is sitting on his chest. Patient was hospitalized for same in 08/21 and at that time was found to be bradycardic with high-grade AV block and periods of third-degree AV block with heart rate as low as the 20s.  10/02: In ED, noted to be bradycardic with heart rate 30s/40s.  History of complete heart block, which resolved following discontinuation of verapamil and metoprolol.  Held metoprolol into mall.  Admitted to hospitalist service with cardiology to consult -pacemaker placement scheduled for 11/25/2021 10/03: No significant events. 10/04: Pacemaker placed, patient doing well after this  Consultants:  Cardiology  Procedures: 11/25/2021: pacemaker placement       ASSESSMENT & PLAN:   Principal Problem:   Symptomatic bradycardia Active Problems:   Chronic bilateral low back pain with bilateral sciatica   Hypertrophic obstructive cardiomyopathy (HOCM) (HCC)   Paroxysmal atrial fibrillation (HCC)   Symptomatic bradycardia Held home metoprolol and timolol  S/p pacemaker placement today 11/25/21  Atropine as needed Cardiology consult  Paroxysmal atrial fibrillation ([place]) Not on anticoagulation Noted after he had a Holter monitor placed for 72 hours with longest episode lasting 53 minutes. Per chart review anticoagulation was discussed with patient but he was reluctant to start due to an upcoming surgical procedure Will seek cardiology opinion re: anticoagulation needed   Hypertrophic obstructive cardiomyopathy (HOCM) ([place]) Symptomatic with complaints of exertional shortness of breath, chest pain and increased fatigue. Cardiology following -  resume beta blocker per their recommendations   Chronic bilateral low back pain with bilateral sciatica Continue opioid therapy    DVT prophylaxis: SCD Pertinent IV fluids/nutrition: Tolerating p.o., no continuous IV fluids Central lines / invasive devices: none  Code Status: FULL CODE Family Communication: Wife at bedside on rounds  Disposition: Inpatient TOC needs: None at this time Barriers to discharge / significant pending items: Pending cardiology clearance but anticipate if remains stable overnight can potentially discharge tomorrow 11/26/2021

## 2021-11-25 NOTE — Progress Notes (Signed)
Pacemaker rep. ([employer]) at bedside, interrogating pacer and speaking with pt./wife re: leadless pacer. Both vebalized understanding of conversation with rep. Re: pacer.

## 2021-11-25 NOTE — Discharge Instructions (Signed)
Please avoid showering/submerging yourself in water for the next week. If your bandage gets wet or starts to fall off, replace it with another piece of gauze and the Tegaderm (clear bandage I provided). You should try to keep it covered for a week. You will follow up with Dr. Paraschos in the office in about 1 week. If you have bleeding from the site, lie down and apply firm pressure for 20 minutes, if it continues to bleed, call our office ([phone]). Avoid heavy lifting, squatting (other than sitting) and strenuous activity for 1 week. You will get a call from [employer] to set up your pacemaker and the [employer] device. You do not need to bring this device with you to appointments. It is used to monitor your pacemaker from home.  

## 2021-11-26 LAB — BASIC METABOLIC PANEL
Anion gap: 8 (ref 5–15)
BUN: 19 mg/dL (ref 8–23)
CO2: 24 mmol/L (ref 22–32)
Calcium: 8.6 mg/dL — ABNORMAL LOW (ref 8.9–10.3)
Chloride: 107 mmol/L (ref 98–111)
Creatinine, Ser: 1.06 mg/dL (ref 0.61–1.24)
GFR, Estimated: >60 mL/min (ref >60)
Glucose, Bld: 102 mg/dL — ABNORMAL HIGH (ref 70–99)
Potassium: 3.9 mmol/L (ref 3.5–5.1)
Sodium: 139 mmol/L (ref 135–145)

## 2021-11-26 NOTE — Discharge Summary (Signed)
Physician Discharge Summary   Patient: Zachary Clements MRN: [id number]  DOB: 1956/02/10   Admit:     Date of Admission: 11/23/2021 Admitted from: home   Discharge: Date of discharge: 11/26/21 Disposition: Home Condition at discharge: good  CODE STATUS: FULL CODE     Discharge Physician: Emeterio Reeve, DO [facility]     PCP: Derinda Late, MD  Recommendations for Outpatient Follow-up:  Follow up with Cardiologist Dr Saralyn Pilar in 1 week and w/ PCP Derinda Late, MD in 2-4 weeks Please follow up on the following pending results: none   Discharge Instructions     Diet - low sodium heart healthy   Complete by: As directed    Increase activity slowly   Complete by: As directed          Discharge Diagnoses: Principal Problem:   Symptomatic bradycardia Active Problems:   Chronic bilateral low back pain with bilateral sciatica   Hypertrophic obstructive cardiomyopathy (HOCM) (HCC)   Paroxysmal atrial fibrillation [facility])       Hospital Course: Thane Age is a 66 y.o. male with medical history significant for hypertrophic cardiomyopathy, hypertension, GERD, arthritis, chronic low back pain who presents to the ER for evaluation of a 3-day history of worsening shortness of breath with exertion, dizziness, lightheadedness and chest pressure which he describes as feeling like someone is sitting on his chest. Patient was hospitalized for same in 08/21 and at that time was found to be bradycardic with high-grade AV block and periods of third-degree AV block with heart rate as low as the 20s.  10/02: In ED, noted to be bradycardic with heart rate 30s/40s.  History of complete heart block, which resolved following discontinuation of verapamil and metoprolol.  Held metoprolol into mall.  Admitted to hospitalist service with cardiology to consult -pacemaker placement scheduled for 11/25/2021 10/03: No significant events. 10/04: Pacemaker placed, patient  doing well after this 10/05: cleared for discharge by cardiology   Consultants:  Cardiology  Procedures: 11/25/2021: pacemaker placement       ASSESSMENT & PLAN:   Principal Problem:   Symptomatic bradycardia Active Problems:   Chronic bilateral low back pain with bilateral sciatica   Hypertrophic obstructive cardiomyopathy (HOCM) (HCC)   Paroxysmal atrial fibrillation (HCC)   Symptomatic bradycardia Held home metoprolol and timolol - ok to resume on discharge  S/p pacemaker placement 11/25/21   Paroxysmal atrial fibrillation ([place]) Not on anticoagulation Follow w/ cardiology   Hypertrophic obstructive cardiomyopathy (HOCM) [facility]) Cardiology following - resume beta blocker on discharge   Chronic bilateral low back pain with bilateral sciatica Continue opioid therapy           Discharge Instructions  Allergies as of 11/26/2021   No Known Allergies      Medication List     STOP taking these medications    HYDROcodone-acetaminophen 10-325 MG tablet Commonly known as: NORCO       TAKE these medications    atorvastatin 10 MG tablet Commonly known as: LIPITOR Take 10 mg by mouth daily with supper.   DULoxetine 60 MG capsule Commonly known as: CYMBALTA Take 120 mg by mouth daily.   gabapentin 300 MG capsule Commonly known as: NEURONTIN Take 300 mg by mouth in the morning, at noon, and at bedtime.   metoprolol succinate 25 MG 24 hr tablet Commonly known as: TOPROL-XL Take 12.5 mg by mouth daily.   multivitamin with minerals Tabs tablet Take 1 tablet by mouth daily.   oxyCODONE-acetaminophen 7.5-325 MG  tablet Commonly known as: PERCOCET Take 1 tablet by mouth 2 (two) times daily.   timolol 0.5 % ophthalmic solution Commonly known as: TIMOPTIC Place 1 drop into both eyes daily.   traZODone 50 MG tablet Commonly known as: DESYREL Take 1 tablet by mouth at bedtime.         Follow-up Information     Paraschos, Elicia Lui, MD. Go in  1 week(s).   Specialty: Cardiology Contact information: [facility] [place] [address] [phone]                 No Known Allergies   Subjective: no CP/SOB, pt feeling well this morning and no concerns about discharge home    Discharge Exam: BP 127/69 (BP Location: Right Arm)   Pulse 68   Temp 97.6 F (36.4 C)   Resp 14   Ht '5\' 10"'$  (1.778 m)   Wt 87.2 kg   SpO2 95%   BMI 27.58 kg/m  General: Pt is alert, awake, not in acute distress Cardiovascular: RRR, S1/S2 +murmur, no rubs, no gallops Respiratory: CTA bilaterally, no wheezing, no rhonchi Abdominal: Soft, NT, ND, bowel sounds + Extremities: no edema, no cyanosis     The results of significant diagnostics from this hospitalization (including imaging, microbiology, ancillary and laboratory) are listed below for reference.     Microbiology: No results found for this or any previous visit (from the past 240 hour(s)).   Labs: BNP (last 3 results) No results for input(s): "BNP" in the last 8760 hours. Basic Metabolic Panel: Recent Labs  Lab 11/23/21 1034 11/24/21 0522 11/26/21 0413  NA 140 140 139  K 3.8 4.2 3.9  CL 109 112* 107  CO2 '23 26 24  '$ GLUCOSE 149* 103* 102*  BUN '17 14 19  '$ CREATININE 1.19 0.96 1.06  CALCIUM 8.9 8.5* 8.6*   Liver Function Tests: Recent Labs  Lab 11/23/21 1034  AST 26  ALT 24  ALKPHOS 42  BILITOT 0.7  PROT 6.2*  ALBUMIN 3.8   No results for input(s): "LIPASE", "AMYLASE" in the last 168 hours. No results for input(s): "AMMONIA" in the last 168 hours. CBC: Recent Labs  Lab 11/23/21 1034 11/24/21 0522  WBC 8.8 5.9  HGB 14.3 13.2  HCT 44.1 40.4  MCV 96.7 96.4  PLT 257 223   Cardiac Enzymes: No results for input(s): "CKTOTAL", "CKMB", "CKMBINDEX", "TROPONINI" in the last 168 hours. BNP: Invalid input(s): "POCBNP" CBG: No results for input(s): "GLUCAP" in the last 168 hours. D-Dimer No results for input(s):  "DDIMER" in the last 72 hours. Hgb A1c No results for input(s): "HGBA1C" in the last 72 hours. Lipid Profile No results for input(s): "CHOL", "HDL", "LDLCALC", "TRIG", "CHOLHDL", "LDLDIRECT" in the last 72 hours. Thyroid function studies Recent Labs    11/23/21 1034  TSH 3.863   Anemia work up No results for input(s): "VITAMINB12", "FOLATE", "FERRITIN", "TIBC", "IRON", "RETICCTPCT" in the last 72 hours. Urinalysis    Component Value Date/Time   COLORURINE AMBER (A) 11/03/2017 1106   APPEARANCEUR HAZY (A) 11/03/2017 1106   LABSPEC 1.021 11/03/2017 1106   PHURINE 5.0 11/03/2017 1106   GLUCOSEU NEGATIVE 11/03/2017 1106   HGBUR NEGATIVE 11/03/2017 1106   BILIRUBINUR NEGATIVE 11/03/2017 1106   KETONESUR NEGATIVE 11/03/2017 1106   PROTEINUR 30 (A) 11/03/2017 1106   NITRITE NEGATIVE 11/03/2017 1106   LEUKOCYTESUR NEGATIVE 11/03/2017 1106   Sepsis Labs Recent Labs  Lab 11/23/21 1034 11/24/21 0522  WBC 8.8 5.9  Microbiology No results found for this or any previous visit (from the past 240 hour(s)). Imaging EP PPM/ICD IMPLANT  Result Date: 11/25/2021 Successful Micra AV leadless pacemaker implantation   ECHOCARDIOGRAM COMPLETE  Result Date: 11/24/2021    ECHOCARDIOGRAM REPORT   Patient Name:   BRYLEY KOVACEVIC Date of Exam: 11/24/2021 Medical Rec #:  [id number]       Height:       70.0 in Accession #:    [id number]      Weight:       192.2 lb Date of Birth:  21-Jun-1955       BSA:          2.053 m Patient Age:    10 years        BP:           128/70 mmHg Patient Gender: M               HR:           35 bpm. Exam Location:  [facility] Procedure: 2D Echo, Cardiac Doppler and Color Doppler Indications:     Hypertrophic obstructive cardiomyopathy I42.1  History:         Patient has prior history of Echocardiogram examinations, most                  recent 10/20/2019. Risk Factors:Hypertension. Hypertrophic                  obstructive cardiomyopathy.  Sonographer:     Sherrie Sport Referring Phys:   [id number] YIRSWNIO AGBATA Diagnosing Phys: Isaias Cowman MD IMPRESSIONS  1. Left ventricular ejection fraction, by estimation, is 60 to 65%. The left ventricle has normal function. The left ventricle has no regional wall motion abnormalities. There is moderate asymmetric left ventricular hypertrophy of the septal segment. Left ventricular diastolic parameters were normal.  2. Right ventricular systolic function is normal. The right ventricular size is normal.  3. The mitral valve is normal in structure. Mild to moderate mitral valve regurgitation. No evidence of mitral stenosis.  4. Tricuspid valve regurgitation is mild to moderate.  5. The aortic valve is normal in structure. Aortic valve regurgitation is not visualized. No aortic stenosis is present.  6. The inferior vena cava is normal in size with greater than 50% respiratory variability, suggesting right atrial pressure of 3 mmHg. FINDINGS  Left Ventricle: Left ventricular ejection fraction, by estimation, is 60 to 65%. The left ventricle has normal function. The left ventricle has no regional wall motion abnormalities. The left ventricular internal cavity size was normal in size. There is  moderate asymmetric left ventricular hypertrophy of the septal segment. Left ventricular diastolic parameters were normal. Right Ventricle: The right ventricular size is normal. No increase in right ventricular wall thickness. Right ventricular systolic function is normal. Left Atrium: Left atrial size was normal in size. Right Atrium: Right atrial size was normal in size. Pericardium: There is no evidence of pericardial effusion. Mitral Valve: The mitral valve is normal in structure. Mild to moderate mitral valve regurgitation. No evidence of mitral valve stenosis. Tricuspid Valve: The tricuspid valve is normal in structure. Tricuspid valve regurgitation is mild to moderate. No evidence of tricuspid stenosis. Aortic Valve: The aortic valve is normal in structure. Aortic  valve regurgitation is not visualized. No aortic stenosis is present. Aortic valve mean gradient measures 14.0 mmHg. Aortic valve peak gradient measures 28.5 mmHg. Aortic valve area, by VTI measures 2.76 cm. Pulmonic Valve: The pulmonic valve was  normal in structure. Pulmonic valve regurgitation is not visualized. No evidence of pulmonic stenosis. Aorta: The aortic root is normal in size and structure. Venous: The inferior vena cava is normal in size with greater than 50% respiratory variability, suggesting right atrial pressure of 3 mmHg. IAS/Shunts: No atrial level shunt detected by color flow Doppler.  LEFT VENTRICLE PLAX 2D LVIDd:         4.20 cm   Diastology LVIDs:         2.90 cm   LV e' medial:    15.20 cm/s LV PW:         1.00 cm   LV E/e' medial:  9.7 LV IVS:        1.10 cm   LV e' lateral:   12.40 cm/s LVOT diam:     2.20 cm   LV E/e' lateral: 11.9 LV SV:         152 LV SV Index:   74 LVOT Area:     3.80 cm  RIGHT VENTRICLE RV Basal diam:  3.10 cm RV Mid diam:    3.50 cm RV S prime:     11.19 cm/s TAPSE (M-mode): 3.1 cm LEFT ATRIUM              Index        RIGHT ATRIUM          Index LA diam:        4.80 cm  2.34 cm/m   RA Area:     9.45 cm LA Vol (A2C):   139.0 ml 67.72 ml/m  RA Volume:   16.20 ml 7.89 ml/m LA Vol (A4C):   129.0 ml 62.85 ml/m LA Biplane Vol: 136.0 ml 66.26 ml/m  AORTIC VALVE AV Area (Vmax):    2.33 cm AV Area (Vmean):   2.74 cm AV Area (VTI):     2.76 cm AV Vmax:           267.00 cm/s AV Vmean:          169.500 cm/s AV VTI:            0.552 m AV Peak Grad:      28.5 mmHg AV Mean Grad:      14.0 mmHg LVOT Vmax:         164.00 cm/s LVOT Vmean:        122.000 cm/s LVOT VTI:          0.401 m LVOT/AV VTI ratio: 0.73  AORTA Ao Root diam: 3.35 cm MITRAL VALVE                TRICUSPID VALVE MV Area (PHT): 4.74 cm     TR Peak grad:   38.7 mmHg MV Decel Time: 160 msec     TR Vmax:        311.00 cm/s MV E velocity: 147.00 cm/s MV A velocity: 56.60 cm/s   SHUNTS MV E/A ratio:  2.60          Systemic VTI:  0.40 m                             Systemic Diam: 2.20 cm Isaias Cowman MD Electronically signed by Isaias Cowman MD Signature Date/Time: 11/24/2021/12:53:55 PM    Final       Time coordinating discharge: over 30 minutes  SIGNED:  Emeterio Reeve DO [facility]

## 2021-11-26 NOTE — Progress Notes (Signed)
  Transition of Care [facility]) Screening Note   Patient Details  Name: Zachary Clements Date of Birth: 1955-05-12   Transition of Care [facility]) CM/SW Contact:    Alberteen Sam, LCSW Phone Number: 11/26/2021, 8:55 AM    Transition of Care Department [facility]) has reviewed patient and no TOC needs have been identified at this time. We will continue to monitor patient advancement through interdisciplinary progression rounds. If new patient transition needs arise, please place a TOC consult.  [place], Zachary Clements

## 2021-11-26 NOTE — Progress Notes (Addendum)
At 0350 am assessment, noted bottle of hospital bleach wipes sitting on patients bedside table that were not there earlier in night. Asked pt about wipes and he stated he has been using them to clean his genital area after using urinal. Explained to patient that these are bleach wipes and should not be used on skin. Pt denies burning or skin irritation on genitals and no redness noted by RN on genitals. Bleach wipes removed from room and RN gave pt bath wipes and instructed to clean self with those now; pt did so. Patient was not concerned about it and at first did not want to clean self with bath wipes afterwards until RN insisted.

## 2021-11-26 NOTE — Progress Notes (Signed)
[facility]  Patient ID: Zachary Clements MRN: [id number] DOB/AGE: 68-Jun-1957 68 y.o.   Admit date: 11/23/2021 Referring Physician [place] Primary Physician [facility] Primary Cardiologist Paraschos Reason for Consultation complete heart block   HPI: 66 year old gentleman referred for evaluation of complete heart block.  The patient has a history of hypertrophic cardiomyopathy, essential hypertension, and history of transient high-grade AV block.  The patient was admitted 10/19/2019 with bradycardia, noted to have high-grade AV block, which resolved after holding verapamil and metoprolol succinate.  Patient resumed low-dose metoprolol succinate and has done well until 2 to 3 days ago he started to experience this of breath and lightheadedness.  He presents to [facility] ED where ECG revealed degree AV block with rates in the upper 30s and low 40s.  Patient remains hemodynamically stable and asymptomatic when laying in bed.  Patient has history of hypertrophic cardiomyopathy.  2D echocardiogram 04/14/2017 revealed LVEF grade 55% with septal wall thickness of 0.9 cm.  Cardiac MRI 04/04/2020 revealed LVEF 69%, septal wall thickness 2.3 cm which is consistent with hypertrophic cardiomyopathy.  The patient has undergone previous cardiac catheterization 10/19/2019 which revealed insignificant coronary artery disease.  Interval History:  -s/p successful micra AV leadless pacemaker implantation with Dr. Saralyn Pilar on 10/4 -no acute events  -No chest pain, shortness of breath, dizziness or other complaints   Vitals:   11/25/21 1925 11/25/21 2329 11/26/21 0341 11/26/21 0731  BP: 111/82 114/73 103/80 127/69  Pulse: 78 72 68 68  Resp: '17 18 14 14  '$ Temp: 98 F (36.7 C) 98.1 F (36.7 C) 97.7 F (36.5 C) 97.6 F (36.4 C)  TempSrc: Oral Oral Oral   SpO2: 94% 97% 95% 95%  Weight:      Height:         Intake/Output Summary (Last 24 hours) at 11/26/2021 0750 Last data filed at 11/26/2021 6789 Gross per 24 hour   Intake --  Output 2625 ml  Net -2625 ml     PHYSICAL EXAM General: Pleasant elderly Caucasian male, well nourished, in no acute distress.  HEENT:  Normocephalic and atraumatic. Neck:   No JVD.  Lungs: Normal respiratory effort on room air.   Heart: HRRR paced rhythm on telemetry Abdomen: non-distended appearing.  Msk: Normal strength and tone for age. Extremities: No clubbing, cyanosis, edema.  R groin: Figure-of-eight suture removed by me with trace bleeding from skin adjacent to incision site, resolved with manual pressure.  Wound without apparent aneurysm, swelling, erythema or ecchymosis.  Covered with clean gauze and Tegaderm. Neuro: Alert and oriented x3 Psych:  mood appropriate for situation.     LABS: Basic Metabolic Panel: Recent Labs    11/24/21 0522 11/26/21 0413  NA 140 139  K 4.2 3.9  CL 112* 107  CO2 26 24  GLUCOSE 103* 102*  BUN 14 19  CREATININE 0.96 1.06  CALCIUM 8.5* 8.6*    Liver Function Tests: Recent Labs    11/23/21 1034  AST 26  ALT 24  ALKPHOS 42  BILITOT 0.7  PROT 6.2*  ALBUMIN 3.8    No results for input(s): "LIPASE", "AMYLASE" in the last 72 hours. CBC: Recent Labs    11/23/21 1034 11/24/21 0522  WBC 8.8 5.9  HGB 14.3 13.2  HCT 44.1 40.4  MCV 96.7 96.4  PLT 257 223    Cardiac Enzymes: No results for input(s): "CKTOTAL", "CKMB", "CKMBINDEX", "TROPONINI" in the last 72 hours. BNP: Invalid input(s): "POCBNP" D-Dimer: No results for input(s): "DDIMER" in the last 72 hours. Hemoglobin  A1C: No results for input(s): "HGBA1C" in the last 72 hours. Fasting Lipid Panel: No results for input(s): "CHOL", "HDL", "LDLCALC", "TRIG", "CHOLHDL", "LDLDIRECT" in the last 72 hours. Thyroid Function Tests: Recent Labs    11/23/21 1034  TSH 3.863    Anemia Panel: No results for input(s): "VITAMINB12", "FOLATE", "FERRITIN", "TIBC", "IRON", "RETICCTPCT" in the last 72 hours.  EP PPM/ICD IMPLANT  Result Date: 11/25/2021 Successful  Micra AV leadless pacemaker implantation   ECHOCARDIOGRAM COMPLETE  Result Date: 11/24/2021    ECHOCARDIOGRAM REPORT   Patient Name:   Zachary Clements Date of Exam: 11/24/2021 Medical Rec #:  [id number]       Height:       70.0 in Accession #:    [id number]      Weight:       192.2 lb Date of Birth:  [birth year]       BSA:          2.053 m Patient Age:    4 years        BP:           128/70 mmHg Patient Gender: M               HR:           35 bpm. Exam Location:  [facility] Procedure: 2D Echo, Cardiac Doppler and Color Doppler Indications:     Hypertrophic obstructive cardiomyopathy I42.1  History:         Patient has prior history of Echocardiogram examinations, most                  recent 10/20/2019. Risk Factors:Hypertension. Hypertrophic                  obstructive cardiomyopathy.  Sonographer:     Sherrie Sport Referring Phys:  [id number] AGBATA Diagnosing Phys: Isaias Cowman MD IMPRESSIONS  1. Left ventricular ejection fraction, by estimation, is 60 to 65%. The left ventricle has normal function. The left ventricle has no regional wall motion abnormalities. There is moderate asymmetric left ventricular hypertrophy of the septal segment. Left ventricular diastolic parameters were normal.  2. Right ventricular systolic function is normal. The right ventricular size is normal.  3. The mitral valve is normal in structure. Mild to moderate mitral valve regurgitation. No evidence of mitral stenosis.  4. Tricuspid valve regurgitation is mild to moderate.  5. The aortic valve is normal in structure. Aortic valve regurgitation is not visualized. No aortic stenosis is present.  6. The inferior vena cava is normal in size with greater than 50% respiratory variability, suggesting right atrial pressure of 3 mmHg. FINDINGS  Left Ventricle: Left ventricular ejection fraction, by estimation, is 60 to 65%. The left ventricle has normal function. The left ventricle has no regional wall motion abnormalities. The left  ventricular internal cavity size was normal in size. There is  moderate asymmetric left ventricular hypertrophy of the septal segment. Left ventricular diastolic parameters were normal. Right Ventricle: The right ventricular size is normal. No increase in right ventricular wall thickness. Right ventricular systolic function is normal. Left Atrium: Left atrial size was normal in size. Right Atrium: Right atrial size was normal in size. Pericardium: There is no evidence of pericardial effusion. Mitral Valve: The mitral valve is normal in structure. Mild to moderate mitral valve regurgitation. No evidence of mitral valve stenosis. Tricuspid Valve: The tricuspid valve is normal in structure. Tricuspid valve regurgitation is mild to moderate. No evidence of tricuspid stenosis. Aortic  Valve: The aortic valve is normal in structure. Aortic valve regurgitation is not visualized. No aortic stenosis is present. Aortic valve mean gradient measures 14.0 mmHg. Aortic valve peak gradient measures 28.5 mmHg. Aortic valve area, by VTI measures 2.76 cm. Pulmonic Valve: The pulmonic valve was normal in structure. Pulmonic valve regurgitation is not visualized. No evidence of pulmonic stenosis. Aorta: The aortic root is normal in size and structure. Venous: The inferior vena cava is normal in size with greater than 50% respiratory variability, suggesting right atrial pressure of 3 mmHg. IAS/Shunts: No atrial level shunt detected by color flow Doppler.  LEFT VENTRICLE PLAX 2D LVIDd:         4.20 cm   Diastology LVIDs:         2.90 cm   LV e' medial:    15.20 cm/s LV PW:         1.00 cm   LV E/e' medial:  9.7 LV IVS:        1.10 cm   LV e' lateral:   12.40 cm/s LVOT diam:     2.20 cm   LV E/e' lateral: 11.9 LV SV:         152 LV SV Index:   74 LVOT Area:     3.80 cm  RIGHT VENTRICLE RV Basal diam:  3.10 cm RV Mid diam:    3.50 cm RV S prime:     11.19 cm/s TAPSE (M-mode): 3.1 cm LEFT ATRIUM              Index        RIGHT ATRIUM           Index LA diam:        4.80 cm  2.34 cm/m   RA Area:     9.45 cm LA Vol (A2C):   139.0 ml 67.72 ml/m  RA Volume:   16.20 ml 7.89 ml/m LA Vol (A4C):   129.0 ml 62.85 ml/m LA Biplane Vol: 136.0 ml 66.26 ml/m  AORTIC VALVE AV Area (Vmax):    2.33 cm AV Area (Vmean):   2.74 cm AV Area (VTI):     2.76 cm AV Vmax:           267.00 cm/s AV Vmean:          169.500 cm/s AV VTI:            0.552 m AV Peak Grad:      28.5 mmHg AV Mean Grad:      14.0 mmHg LVOT Vmax:         164.00 cm/s LVOT Vmean:        122.000 cm/s LVOT VTI:          0.401 m LVOT/AV VTI ratio: 0.73  AORTA Ao Root diam: 3.35 cm MITRAL VALVE                TRICUSPID VALVE MV Area (PHT): 4.74 cm     TR Peak grad:   38.7 mmHg MV Decel Time: 160 msec     TR Vmax:        311.00 cm/s MV E velocity: 147.00 cm/s MV A velocity: 56.60 cm/s   SHUNTS MV E/A ratio:  2.60         Systemic VTI:  0.40 m                             Systemic Diam: 2.20 cm [place] Coil  Paraschos MD Electronically signed by Isaias Cowman MD Signature Date/Time: 11/24/2021/12:53:55 PM    Final     EKG: v paced 76  TELEMETRY:  V paced 60s to 70s  Data reviewed by me: hospitalist progress note, cbc, bmp, telemetry, ekg, vitals, discussed with hospitalist   ASSESSMENT AND PLAN:  Principal Problem:   Symptomatic bradycardia Active Problems:   Chronic bilateral low back pain with bilateral sciatica   Hypertrophic obstructive cardiomyopathy (HOCM) (HCC)   Paroxysmal atrial fibrillation ([place])    1. Complete heart block, ventricular escape rate at 42 bpm, hemodynamically stable, with history of high-grade AV block,  2.  Hypertrophic cardiomyopathy, minimally symptomatic 3.  Insignificant coronary artery disease by cardiac catheterization 10/19/2019   Recommendations   1.  Agree with current therapy 2.  ok to resume metoprolol succinate 3.  s/p successful Micra AV leadless pacemaker implantation on 11/25/2021 with Dr. Saralyn Pilar 4.  Aftercare instructions discussed  with patient and placed in chart.  5.   OK for discharge today from a cardiac standpoint, follow up with Dr. Saralyn Pilar in office in 1 week.   This patient's plan of care was discussed and created with Dr. Saralyn Pilar and he is in agreement.    Tristan Schroeder, PA-C 11/26/2021 7:50 AM

## 2021-11-27 ENCOUNTER — Encounter: Payer: Self-pay | Admitting: Cardiology

## 2022-06-28 ENCOUNTER — Other Ambulatory Visit: Payer: Self-pay

## 2022-06-28 DIAGNOSIS — D509 Iron deficiency anemia, unspecified: Secondary | ICD-10-CM

## 2022-06-28 DIAGNOSIS — D472 Monoclonal gammopathy: Secondary | ICD-10-CM

## 2022-06-29 ENCOUNTER — Inpatient Hospital Stay: Payer: Medicare Other | Attending: Oncology

## 2022-06-29 DIAGNOSIS — D472 Monoclonal gammopathy: Secondary | ICD-10-CM | POA: Diagnosis present

## 2022-06-29 DIAGNOSIS — D509 Iron deficiency anemia, unspecified: Secondary | ICD-10-CM | POA: Diagnosis not present

## 2022-06-29 DIAGNOSIS — Z87891 Personal history of nicotine dependence: Secondary | ICD-10-CM | POA: Insufficient documentation

## 2022-06-29 LAB — CMP (CANCER CENTER ONLY)
ALT: 17 U/L (ref 0–44)
AST: 21 U/L (ref 15–41)
Albumin: 4.1 g/dL (ref 3.5–5.0)
Alkaline Phosphatase: 53 U/L (ref 38–126)
Anion gap: 10 (ref 5–15)
BUN: 19 mg/dL (ref 8–23)
CO2: 28 mmol/L (ref 22–32)
Calcium: 9.2 mg/dL (ref 8.9–10.3)
Chloride: 103 mmol/L (ref 98–111)
Creatinine: 1.1 mg/dL (ref 0.61–1.24)
GFR, Estimated: >60 mL/min (ref >60)
Glucose, Bld: 106 mg/dL — ABNORMAL HIGH (ref 70–99)
Potassium: 4.8 mmol/L (ref 3.5–5.1)
Sodium: 141 mmol/L (ref 135–145)
Total Bilirubin: 0.5 mg/dL (ref 0.3–1.2)
Total Protein: 6.6 g/dL (ref 6.5–8.1)

## 2022-06-29 LAB — IRON AND TIBC
Iron: 98 ug/dL (ref 45–182)
Saturation Ratios: 25 % (ref 17.9–39.5)
TIBC: 396 ug/dL (ref 250–450)
UIBC: 298 ug/dL

## 2022-06-29 LAB — CBC (CANCER CENTER ONLY)
HCT: 45.8 % (ref 39.0–52.0)
Hemoglobin: 15.1 g/dL (ref 13.0–17.0)
MCH: 32.3 pg (ref 26.0–34.0)
MCHC: 33 g/dL (ref 30.0–36.0)
MCV: 97.9 fL (ref 80.0–100.0)
Platelet Count: 264 10*3/uL (ref 150–400)
RBC: 4.68 MIL/uL (ref 4.22–5.81)
RDW: 12.9 % (ref 11.5–15.5)
WBC Count: 5.5 10*3/uL (ref 4.0–10.5)
nRBC: 0 % (ref 0.0–0.2)

## 2022-06-29 LAB — FERRITIN: Ferritin: 19 ng/mL — ABNORMAL LOW (ref 24–336)

## 2022-06-30 LAB — KAPPA/LAMBDA LIGHT CHAINS
Kappa free light chain: 16.9 mg/L (ref 3.3–19.4)
Kappa, lambda light chain ratio: 1.2 (ref 0.26–1.65)
Lambda free light chains: 14.1 mg/L (ref 5.7–26.3)

## 2022-07-02 ENCOUNTER — Inpatient Hospital Stay: Payer: Medicare Other | Admitting: Oncology

## 2022-07-02 LAB — PROTEIN ELECTROPHORESIS, SERUM
A/G Ratio: 1.6 (ref 0.7–1.7)
Albumin ELP: 3.7 g/dL (ref 2.9–4.4)
Alpha-1-Globulin: 0.2 g/dL (ref 0.0–0.4)
Alpha-2-Globulin: 0.6 g/dL (ref 0.4–1.0)
Beta Globulin: 0.9 g/dL (ref 0.7–1.3)
Gamma Globulin: 0.6 g/dL (ref 0.4–1.8)
Globulin, Total: 2.3 g/dL (ref 2.2–3.9)
M-Spike, %: 0.2 g/dL — ABNORMAL HIGH
Total Protein ELP: 6 g/dL (ref 6.0–8.5)

## 2022-07-13 ENCOUNTER — Encounter: Payer: Self-pay | Admitting: Oncology

## 2022-07-13 ENCOUNTER — Inpatient Hospital Stay: Payer: Medicare Other | Admitting: Oncology

## 2022-07-13 VITALS — BP 91/77 | HR 57 | Temp 98.3°F | Resp 18 | Ht 70.0 in | Wt 198.7 lb

## 2022-07-13 DIAGNOSIS — D472 Monoclonal gammopathy: Secondary | ICD-10-CM

## 2022-07-13 DIAGNOSIS — D509 Iron deficiency anemia, unspecified: Secondary | ICD-10-CM

## 2022-07-13 NOTE — Progress Notes (Signed)
Hematology/Oncology Consult note [facility]  Telephone:([phone] Fax:([phone]  Patient Care Team: Kandyce Rud, MD as PCP - General (Family Medicine)   Name of the patient: Zachary Clements  [id number]  15-Aug-1955   Date of visit: 07/13/22  Diagnosis-abnormal SPEP  Chief complaint/ Reason for visit-IgG MGUS follow-up  Heme/Onc history:  Patient is a 67 year old male with a past medical history significant for hypertension hyperlipidemia cardiomyopathy and GERD who was seen by rheumatology for symptoms of inflammatory arthritis.  He was started on methotrexate weekly for the same.  As a part of the work-up he had multiple blood work checked including no CBC which showed a white count of 8.8, H&H of 12.2/39.1 with an MCV of 90 CMP was normal.  He also had SPEP checked which showed IgG lambda light chain specificity with an M protein of 0.2.  IgG level is normal at 1032.  Patient referred for further work-up   Mild anemia but no evidence of kidney injury or hypercalcemia. 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 Given that IgG M protein was less than 1.5, bone marrow was not recommended.    Iron deficiency anemia- mild anemia with hemoglobin 11-12. Iron studies consistent with iron deficiency with ferritin of 7 and elevated TIBC. He started oral iron. He had a positive cologuard test in January 2022 and underwent colonoscopy which was unremarkable. Endoscopy and capsule were recommended.   Interval history- Patient was hospitalized in October 2023 for bradycardia ultimately requiring pacemaker placement.  He has baseline fatigue but denies any new complaints at this time  ECOG PS- 2 Pain scale- 0   Review of systems- Review of Systems  Constitutional:  Positive for malaise/fatigue. Negative for chills, fever and weight loss.  HENT:  Negative for congestion, ear discharge and nosebleeds.   Eyes:  Negative for blurred vision.   Respiratory:  Negative for cough, hemoptysis, sputum production, shortness of breath and wheezing.   Cardiovascular:  Negative for chest pain, palpitations, orthopnea and claudication.  Gastrointestinal:  Negative for abdominal pain, blood in stool, constipation, diarrhea, heartburn, melena, nausea and vomiting.  Genitourinary:  Negative for dysuria, flank pain, frequency, hematuria and urgency.  Musculoskeletal:  Negative for back pain, joint pain and myalgias.  Skin:  Negative for rash.  Neurological:  Negative for dizziness, tingling, focal weakness, seizures, weakness and headaches.  Endo/Heme/Allergies:  Does not bruise/bleed easily.  Psychiatric/Behavioral:  Negative for depression and suicidal ideas. The patient does not have insomnia.       No Known Allergies   Past Medical History:  Diagnosis Date   Arthritis    Dysrhythmia    GERD (gastroesophageal reflux disease)    Hypertension    Hypertrophic cardiomyopathy (HCC)    Insomnia      Past Surgical History:  Procedure Laterality Date   BACK SURGERY     rods in back   BACK SURGERY     removal of rods in back   COLONOSCOPY WITH PROPOFOL N/A 03/05/2020   Procedure: COLONOSCOPY WITH PROPOFOL;  Surgeon: Toledo, Boykin Nearing, MD;  Location: [facility] ENDOSCOPY;  Service: Gastroenterology;  Laterality: N/A;   colonscopy     KNEE ARTHROSCOPY Right    LEFT HEART CATH AND CORONARY ANGIOGRAPHY N/A 10/19/2019   Procedure: LEFT HEART CATH AND CORONARY ANGIOGRAPHY;  Surgeon: Yvonne Kendall, MD;  Location: [facility] INVASIVE CV LAB;  Service: Cardiovascular;  Laterality: N/A;   LUMBAR LAMINECTOMY/DECOMPRESSION MICRODISCECTOMY N/A 11/09/2017   Procedure: LUMBAR LAMINECTOMY/DECOMPRESSION  MICRODISCECTOMY 1 LEVEL-4-5;  Surgeon: Venetia Night, MD;  Location: [facility] ORS;  Service: Neurosurgery;  Laterality: N/A;   PACEMAKER LEADLESS INSERTION N/A 11/25/2021   Procedure: PACEMAKER LEADLESS INSERTION;  Surgeon: Marcina Millard, MD;  Location:  [facility] INVASIVE CV LAB;  Service: Cardiovascular;  Laterality: N/A;   TEMPORARY PACEMAKER N/A 10/19/2019   Procedure: TEMPORARY PACEMAKER;  Surgeon: Yvonne Kendall, MD;  Location: [facility] INVASIVE CV LAB;  Service: Cardiovascular;  Laterality: N/A;    Social History   Socioeconomic History   Marital status: Married    Spouse name: Not on file   Number of children: Not on file   Years of education: Not on file   Highest education level: Not on file  Occupational History   Not on file  Tobacco Use   Smoking status: Former    Types: Cigarettes    Quit date: 11/04/1998    Years since quitting: 23.7   Smokeless tobacco: Never  Vaping Use   Vaping Use: Never used  Substance and Sexual Activity   Alcohol use: Not Currently   Drug use: Not on file    Comment: cbd oil   Sexual activity: Not on file  Other Topics Concern   Not on file  Social History Narrative   Not on file   Social Determinants of Health   Financial Resource Strain: Not on file  Food Insecurity: Not on file  Transportation Needs: Not on file  Physical Activity: Not on file  Stress: Not on file  Social Connections: Not on file  Intimate Partner Violence: Not on file    History reviewed. No pertinent family history.   Current Outpatient Medications:    aspirin EC 81 MG tablet, Take by mouth., Disp: , Rfl:    atorvastatin (LIPITOR) 10 MG tablet, Take 10 mg by mouth daily with supper., Disp: , Rfl:    DULoxetine (CYMBALTA) 60 MG capsule, Take 120 mg by mouth daily., Disp: , Rfl:    gabapentin (NEURONTIN) 300 MG capsule, Take 300 mg by mouth in the morning, at noon, and at bedtime., Disp: , Rfl:    metoprolol succinate (TOPROL-XL) 25 MG 24 hr tablet, Take 12.5 mg by mouth daily., Disp: , Rfl:    Multiple Vitamin (MULTIVITAMIN WITH MINERALS) TABS tablet, Take 1 tablet by mouth daily., Disp: , Rfl:    naproxen (NAPROSYN) 500 MG tablet, 1 po bid prn, Disp: , Rfl:    oxyCODONE-acetaminophen (PERCOCET) 7.5-325 MG  tablet, Take 1 tablet by mouth 2 (two) times daily., Disp: , Rfl:    timolol (TIMOPTIC) 0.5 % ophthalmic solution, Place 1 drop into both eyes daily. , Disp: , Rfl:    traZODone (DESYREL) 50 MG tablet, Take 1 tablet by mouth at bedtime., Disp: , Rfl:   Physical exam: There were no vitals filed for this visit. Physical Exam Cardiovascular:     Rate and Rhythm: Normal rate and regular rhythm.     Heart sounds: Normal heart sounds.  Pulmonary:     Effort: Pulmonary effort is normal.     Breath sounds: Normal breath sounds.  Abdominal:     General: Bowel sounds are normal.     Palpations: Abdomen is soft.  Skin:    General: Skin is warm and dry.  Neurological:     Mental Status: He is alert and oriented to person, place, and time.         Latest Ref Rng & Units 06/29/2022    1:01 PM  CMP  Glucose 70 -  99 mg/dL 956   BUN 8 - 23 mg/dL 19   Creatinine 2.13 - 1.24 mg/dL 0.86   Sodium 578 - 469 mmol/L 141   Potassium 3.5 - 5.1 mmol/L 4.8   Chloride 98 - 111 mmol/L 103   CO2 22 - 32 mmol/L 28   Calcium 8.9 - 10.3 mg/dL 9.2   Total Protein 6.5 - 8.1 g/dL 6.6   Total Bilirubin 0.3 - 1.2 mg/dL 0.5   Alkaline Phos 38 - 126 U/L 53   AST 15 - 41 U/L 21   ALT 0 - 44 U/L 17       Latest Ref Rng & Units 06/29/2022    1:01 PM  CBC  WBC 4.0 - 10.5 K/uL 5.5   Hemoglobin 13.0 - 17.0 g/dL 62.9   Hematocrit 52.8 - 52.0 % 45.8   Platelets 150 - 400 K/uL 264      Assessment and plan- Patient is a 67 y.o. 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  M protein remains stable around 0.2 g.  No evidence of crab criteria.  MGUS can therefore be monitored on a yearly basis.  Iron deficiency: He has chronically low ferritin levels without overt anemia.  He is taking once a day iron.  I will hold off on any IV iron at this time.  Labs and see me in 1 year   Visit Diagnosis 1. MGUS (monoclonal gammopathy of unknown  significance)   2. Iron deficiency anemia, unspecified iron deficiency anemia type      Dr. Owens Shark, MD, MPH [facility] [phone] 07/13/2022 1:23 PM

## 2023-07-13 ENCOUNTER — Inpatient Hospital Stay: Payer: Medicare Other | Attending: Oncology

## 2023-07-13 DIAGNOSIS — D472 Monoclonal gammopathy: Secondary | ICD-10-CM | POA: Insufficient documentation

## 2023-07-13 DIAGNOSIS — D509 Iron deficiency anemia, unspecified: Secondary | ICD-10-CM | POA: Diagnosis not present

## 2023-07-13 DIAGNOSIS — Z87891 Personal history of nicotine dependence: Secondary | ICD-10-CM | POA: Diagnosis not present

## 2023-07-13 LAB — FERRITIN: Ferritin: 10 ng/mL — ABNORMAL LOW (ref 24–336)

## 2023-07-13 LAB — IRON AND TIBC
Iron: 95 ug/dL (ref 45–182)
Saturation Ratios: 22 % (ref 17.9–39.5)
TIBC: 442 ug/dL (ref 250–450)
UIBC: 347 ug/dL

## 2023-07-13 LAB — CBC
HCT: 46.4 % (ref 39.0–52.0)
Hemoglobin: 15.5 g/dL (ref 13.0–17.0)
MCH: 32.1 pg (ref 26.0–34.0)
MCHC: 33.4 g/dL (ref 30.0–36.0)
MCV: 96.1 fL (ref 80.0–100.0)
Platelets: 327 10*3/uL (ref 150–400)
RBC: 4.83 MIL/uL (ref 4.22–5.81)
RDW: 12.6 % (ref 11.5–15.5)
WBC: 6.9 10*3/uL (ref 4.0–10.5)
nRBC: 0 % (ref 0.0–0.2)

## 2023-07-13 LAB — COMPREHENSIVE METABOLIC PANEL WITH GFR
ALT: 18 U/L (ref 0–44)
AST: 20 U/L (ref 15–41)
Albumin: 4.2 g/dL (ref 3.5–5.0)
Alkaline Phosphatase: 62 U/L (ref 38–126)
Anion gap: 9 (ref 5–15)
BUN: 17 mg/dL (ref 8–23)
CO2: 25 mmol/L (ref 22–32)
Calcium: 8.9 mg/dL (ref 8.9–10.3)
Chloride: 104 mmol/L (ref 98–111)
Creatinine, Ser: 1.19 mg/dL (ref 0.61–1.24)
GFR, Estimated: >60 mL/min (ref >60)
Glucose, Bld: 125 mg/dL — ABNORMAL HIGH (ref 70–99)
Potassium: 4.1 mmol/L (ref 3.5–5.1)
Sodium: 138 mmol/L (ref 135–145)
Total Bilirubin: 0.7 mg/dL (ref 0.0–1.2)
Total Protein: 6.8 g/dL (ref 6.5–8.1)

## 2023-07-14 LAB — KAPPA/LAMBDA LIGHT CHAINS
Kappa free light chain: 14.6 mg/L (ref 3.3–19.4)
Kappa, lambda light chain ratio: 2.03 — ABNORMAL HIGH (ref 0.26–1.65)
Lambda free light chains: 7.2 mg/L (ref 5.7–26.3)

## 2023-07-15 LAB — MULTIPLE MYELOMA PANEL, SERUM
Albumin SerPl Elph-Mcnc: 3.9 g/dL (ref 2.9–4.4)
Albumin/Glob SerPl: 1.7 (ref 0.7–1.7)
Alpha 1: 0.2 g/dL (ref 0.0–0.4)
Alpha2 Glob SerPl Elph-Mcnc: 0.6 g/dL (ref 0.4–1.0)
B-Globulin SerPl Elph-Mcnc: 0.9 g/dL (ref 0.7–1.3)
Gamma Glob SerPl Elph-Mcnc: 0.6 g/dL (ref 0.4–1.8)
Globulin, Total: 2.3 g/dL (ref 2.2–3.9)
IgA: 104 mg/dL (ref 61–437)
IgG (Immunoglobin G), Serum: 751 mg/dL (ref 603–1613)
IgM (Immunoglobulin M), Srm: 12 mg/dL — ABNORMAL LOW (ref 20–172)
M Protein SerPl Elph-Mcnc: 0.2 g/dL — ABNORMAL HIGH
Total Protein ELP: 6.2 g/dL (ref 6.0–8.5)

## 2023-07-19 ENCOUNTER — Other Ambulatory Visit: Payer: Self-pay

## 2023-07-19 DIAGNOSIS — D509 Iron deficiency anemia, unspecified: Secondary | ICD-10-CM

## 2023-07-19 DIAGNOSIS — D472 Monoclonal gammopathy: Secondary | ICD-10-CM

## 2023-07-20 ENCOUNTER — Encounter: Payer: Self-pay | Admitting: Oncology

## 2023-07-20 ENCOUNTER — Inpatient Hospital Stay: Payer: Medicare Other | Admitting: Oncology

## 2023-07-20 DIAGNOSIS — D509 Iron deficiency anemia, unspecified: Secondary | ICD-10-CM

## 2023-07-20 DIAGNOSIS — D472 Monoclonal gammopathy: Secondary | ICD-10-CM

## 2023-07-20 NOTE — Progress Notes (Unsigned)
 +++++    Hematology/Oncology Consult note [facility]  [phone] [phone]  Patient Care Team: Nestor Banter, MD as PCP - General (Family Medicine)   Name of the patient: Zachary Clements  [id number]  September 10, 1955   Date of visit: 07/20/23  Diagnosis-IgG lambda MGUS    Chief complaint/ Reason for visit- routine f/u of MGUS  Heme/Onc history:  Patient is a 68 year old male with a past medical history significant for hypertension hyperlipidemia cardiomyopathy and GERD who was seen by rheumatology for symptoms of inflammatory arthritis.  He was started on methotrexate weekly for the same.  As a part of the work-up he had multiple blood work checked including no CBC which showed a white count of 8.8, H&H of 12.2/39.1 with an MCV of 90 CMP was normal.  He also had SPEP checked which showed IgG lambda light chain specificity with an M protein of 0.2.  IgG level is normal at 1032.  Patient referred for further work-up   Mild anemia but no evidence of kidney injury or hypercalcemia. 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 Given that IgG M protein was less than 1.5, bone marrow was not recommended.    Iron deficiency anemia- mild anemia with hemoglobin 11-12. Iron studies consistent with iron deficiency with ferritin of 7 and elevated TIBC. He started oral iron. He had a positive cologuard test in January 2022 and underwent colonoscopy which was unremarkable. Endoscopy and capsule were recommended.   Interval history-patient is doing well overall.  Appetite and weight have remained unchanged.  Denies any new aches and pains anywhere.  ECOG PS- 1 Pain scale- 0   Review of systems- Review of Systems  Constitutional:  Negative for chills, fever, malaise/fatigue and weight loss.  HENT:  Negative for congestion, ear discharge and nosebleeds.   Eyes:  Negative for blurred vision.  Respiratory:  Negative for cough, hemoptysis,  sputum production, shortness of breath and wheezing.   Cardiovascular:  Negative for chest pain, palpitations, orthopnea and claudication.  Gastrointestinal:  Negative for abdominal pain, blood in stool, constipation, diarrhea, heartburn, melena, nausea and vomiting.  Genitourinary:  Negative for dysuria, flank pain, frequency, hematuria and urgency.  Musculoskeletal:  Negative for back pain, joint pain and myalgias.  Skin:  Negative for rash.  Neurological:  Negative for dizziness, tingling, focal weakness, seizures, weakness and headaches.  Endo/Heme/Allergies:  Does not bruise/bleed easily.  Psychiatric/Behavioral:  Negative for depression and suicidal ideas. The patient does not have insomnia.       No Known Allergies   Past Medical History:  Diagnosis Date   Arthritis    Dysrhythmia    GERD (gastroesophageal reflux disease)    Hypertension    Hypertrophic cardiomyopathy (HCC)    Insomnia      Past Surgical History:  Procedure Laterality Date   BACK SURGERY     rods in back   BACK SURGERY     removal of rods in back   COLONOSCOPY WITH PROPOFOL  N/A 03/05/2020   Procedure: COLONOSCOPY WITH PROPOFOL ;  Surgeon: Toledo, Alphonsus Jeans, MD;  Location: [facility] ENDOSCOPY;  Service: Gastroenterology;  Laterality: N/A;   colonscopy     KNEE ARTHROSCOPY Right    LEFT HEART CATH AND CORONARY ANGIOGRAPHY N/A 10/19/2019   Procedure: LEFT HEART CATH AND CORONARY ANGIOGRAPHY;  Surgeon: Sammy Crisp, MD;  Location: [facility] INVASIVE CV LAB;  Service: Cardiovascular;  Laterality: N/A;   LUMBAR LAMINECTOMY/DECOMPRESSION MICRODISCECTOMY N/A 11/09/2017   Procedure: LUMBAR LAMINECTOMY/DECOMPRESSION MICRODISCECTOMY 1  LEVEL-4-5;  Surgeon: Jodeen Munch, MD;  Location: [facility] ORS;  Service: Neurosurgery;  Laterality: N/A;   PACEMAKER LEADLESS INSERTION N/A 11/25/2021   Procedure: PACEMAKER LEADLESS INSERTION;  Surgeon: Percival Brace, MD;  Location: [facility] INVASIVE CV LAB;  Service: Cardiovascular;   Laterality: N/A;   TEMPORARY PACEMAKER N/A 10/19/2019   Procedure: TEMPORARY PACEMAKER;  Surgeon: Sammy Crisp, MD;  Location: [facility] INVASIVE CV LAB;  Service: Cardiovascular;  Laterality: N/A;    Social History   Socioeconomic History   Marital status: Married    Spouse name: Not on file   Number of children: Not on file   Years of education: Not on file   Highest education level: Not on file  Occupational History   Not on file  Tobacco Use   Smoking status: Former    Current packs/day: 0.00    Types: Cigarettes    Quit date: 11/04/1998    Years since quitting: 24.7   Smokeless tobacco: Never  Vaping Use   Vaping status: Never Used  Substance and Sexual Activity   Alcohol use: Not Currently   Drug use: Not on file    Comment: cbd oil   Sexual activity: Not on file  Other Topics Concern   Not on file  Social History Narrative   Not on file   Social Drivers of Health   Financial Resource Strain: Patient Declined (12/30/2022)   Received from [facility]   Overall Financial Resource Strain (CARDIA)    Difficulty of Paying Living Expenses: Patient declined  Food Insecurity: Patient Declined (12/30/2022)   Received from [facility]   Hunger Vital Sign    Worried About Running Out of Food in the Last Year: Patient declined    Ran Out of Food in the Last Year: Patient declined  Transportation Needs: Patient Declined (12/30/2022)   Received from [facility]   PRAPARE - Transportation    In the past 12 months, has lack of transportation kept you from medical appointments or from getting medications?: Patient declined    Lack of Transportation (Non-Medical): Patient declined  Physical Activity: Not on file  Stress: Not on file  Social Connections: Not on file  Intimate Partner Violence: Not on file    No family history on file.   Current Outpatient Medications:    aspirin  EC 81 MG tablet, Take by mouth., Disp: ,  Rfl:    atorvastatin  (LIPITOR) 10 MG tablet, Take 10 mg by mouth daily with supper., Disp: , Rfl:    DULoxetine  (CYMBALTA ) 60 MG capsule, Take 120 mg by mouth daily., Disp: , Rfl:    gabapentin  (NEURONTIN ) 300 MG capsule, Take 300 mg by mouth in the morning, at noon, and at bedtime., Disp: , Rfl:    metoprolol succinate (TOPROL-XL) 25 MG 24 hr tablet, Take 12.5 mg by mouth daily., Disp: , Rfl:    Multiple Vitamin (MULTIVITAMIN WITH MINERALS) TABS tablet, Take 1 tablet by mouth daily., Disp: , Rfl:    naproxen (NAPROSYN) 500 MG tablet, 1 po bid prn, Disp: , Rfl:    oxyCODONE -acetaminophen  (PERCOCET) 7.5-325 MG tablet, Take 1 tablet by mouth 2 (two) times daily., Disp: , Rfl:    timolol (TIMOPTIC) 0.5 % ophthalmic solution, Place 1 drop into both eyes daily. , Disp: , Rfl:    traZODone  (DESYREL ) 50 MG tablet, Take 1 tablet by mouth at bedtime., Disp: , Rfl:   Physical exam: There were no vitals filed for this visit. Physical Exam Cardiovascular:  Rate and Rhythm: Normal rate and regular rhythm.     Heart sounds: Normal heart sounds.  Pulmonary:     Effort: Pulmonary effort is normal.     Breath sounds: Normal breath sounds.  Abdominal:     General: Bowel sounds are normal.     Palpations: Abdomen is soft.  Skin:    General: Skin is warm and dry.  Neurological:     Mental Status: He is alert and oriented to person, place, and time.      I have personally reviewed labs listed below:    Latest Ref Rng & Units 07/13/2023    1:08 PM  CMP  Glucose 70 - 99 mg/dL 528   BUN 8 - 23 mg/dL 17   Creatinine 4.13 - 1.24 mg/dL 2.44   Sodium 010 - 272 mmol/L 138   Potassium 3.5 - 5.1 mmol/L 4.1   Chloride 98 - 111 mmol/L 104   CO2 22 - 32 mmol/L 25   Calcium  8.9 - 10.3 mg/dL 8.9   Total Protein 6.5 - 8.1 g/dL 6.8   Total Bilirubin 0.0 - 1.2 mg/dL 0.7   Alkaline Phos 38 - 126 U/L 62   AST 15 - 41 U/L 20   ALT 0 - 44 U/L 18       Latest Ref Rng & Units 07/13/2023    1:08 PM  CBC  WBC  4.0 - 10.5 K/uL 6.9   Hemoglobin 13.0 - 17.0 g/dL 53.6   Hematocrit 64.4 - 52.0 % 46.4   Platelets 150 - 400 K/uL 327     Assessment and plan- Patient is a 68 y.o. male here for routine follow-up of IgG lambda MGUS  Patient has low risk IgG lambdaMGUS with an M protein that fluctuates between 0.2 to 0.3 g and has remained unchanged over the last 3 years.  Serum free light chain ratio remained stable around 2 as well.  CBC and CMP are within normal limits and no evidence of crab criteria.  MGUS does not require any treatment at this time.  We will monitor his labs on a yearly basis and see him 2 weeks after labs.  Patient has history of iron deficiency although he remains asymptomatic and does not wish to proceed with IV iron at this time.  We will continue to monitor his iron labs on a yearly basis as well and he will take oral iron as tolerated   Visit Diagnosis 1. MGUS (monoclonal gammopathy of unknown significance)   2. Iron deficiency anemia, unspecified iron deficiency anemia type      Dr. Seretha Dance, MD, MPH [facility] [phone] 07/20/2023 1:04 PM

## 2023-07-20 NOTE — Progress Notes (Unsigned)
 Patient has been doing well, no new questions or concerns for the doctor today.

## 2024-01-23 ENCOUNTER — Other Ambulatory Visit: Payer: Self-pay | Admitting: Physical Medicine & Rehabilitation

## 2024-01-23 DIAGNOSIS — G8929 Other chronic pain: Secondary | ICD-10-CM

## 2024-01-31 ENCOUNTER — Other Ambulatory Visit (HOSPITAL_COMMUNITY): Payer: Self-pay | Admitting: Physical Medicine & Rehabilitation

## 2024-01-31 DIAGNOSIS — G8929 Other chronic pain: Secondary | ICD-10-CM

## 2024-03-01 ENCOUNTER — Other Ambulatory Visit (HOSPITAL_COMMUNITY): Payer: Self-pay | Admitting: Physical Medicine & Rehabilitation

## 2024-03-01 DIAGNOSIS — G8929 Other chronic pain: Secondary | ICD-10-CM

## 2024-03-06 ENCOUNTER — Inpatient Hospital Stay (HOSPITAL_COMMUNITY)
Admission: RE | Admit: 2024-03-06 | Discharge: 2024-03-06 | Attending: Physical Medicine & Rehabilitation | Admitting: Physical Medicine & Rehabilitation

## 2024-03-06 ENCOUNTER — Ambulatory Visit (HOSPITAL_COMMUNITY)
Admission: RE | Admit: 2024-03-06 | Discharge: 2024-03-06 | Disposition: A | Discharge status: Home / Self Care | Source: Ambulatory Visit | Attending: Physical Medicine & Rehabilitation | Admitting: Physical Medicine & Rehabilitation

## 2024-03-06 DIAGNOSIS — M5442 Lumbago with sciatica, left side: Secondary | ICD-10-CM | POA: Insufficient documentation

## 2024-03-06 DIAGNOSIS — G8929 Other chronic pain: Secondary | ICD-10-CM

## 2024-03-06 DIAGNOSIS — M5441 Lumbago with sciatica, right side: Secondary | ICD-10-CM | POA: Diagnosis present

## 2024-03-06 NOTE — Progress Notes (Signed)
 Patient was monitored by this RN during MRI scan due to presence of a pacemaker. Cardiac rhythm was continuously monitored throughout the procedure. Prior to the start of the scan, the pacemaker was placed in MRI-safe mode by the MRI technician and/or pacemaker representative. Patient was programmed to DOO 90 bpm. Following the completion of the scan, the device was returned to its pre-MRI settings. Neurological status and orientation post-procedure were unchanged from baseline.   Pre-procedure Heart Rate (Prior to being placed in MRI safe mode): 63-75 bpm Post-procedure Heart Rate (Once pacemaker is returned to baseline mode): 74 bpm

## 2024-03-15 NOTE — Progress Notes (Signed)
 "   Referring Physician:  Dodson Delon FERNS, MD [address] [place],  [place] [address]  Primary Physician:  Diedra Lame, MD  History of Present Illness: 03/20/2024 Mr. Zachary Clements is here today with a chief complaint of primarily back pain.  He has some leg pain, but mostly back pain.  It has been getting worse over time.  He had surgery in 2019 which helped with his leg pain, but he continues to have significant back pain.  He has tried physical therapy without improvement.  Bowel/Bladder Dysfunction: none  Conservative measures:  Physical therapy: Has been participating in [facility]  02/01/2024-ongoing.  Multimodal medical therapy including regular antiinflammatories: duloxetine , gabapentin , oxycodone , naproxen Injections:  10/03/23: Right S1 TF ESI (50% relief) 06/09/23: Right S1 TF ESI (80% relief)  Past Surgery:  11/09/17: L4-5 Laminectomy  Zachary Clements has no symptoms of cervical myelopathy.  The symptoms are causing a significant impact on the patient's life.   I have utilized the care everywhere function in epic to review the outside records available from external health systems.   Review of Systems:  A 10 point review of systems is negative, except for the pertinent positives and negatives detailed in the HPI.  Past Medical History: Past Medical History:  Diagnosis Date   Arthritis    Dysrhythmia    GERD (gastroesophageal reflux disease)    Hypertension    Hypertrophic cardiomyopathy (HCC)    Insomnia     Past Surgical History: Past Surgical History:  Procedure Laterality Date   BACK SURGERY     rods in back   BACK SURGERY     removal of rods in back   COLONOSCOPY WITH PROPOFOL  N/A 03/05/2020   Procedure: COLONOSCOPY WITH PROPOFOL ;  Surgeon: Toledo, Ladell POUR, MD;  Location: [facility];  Service: Gastroenterology;  Laterality: N/A;   colonscopy     KNEE ARTHROSCOPY Right    LEFT HEART CATH AND CORONARY ANGIOGRAPHY N/A 10/19/2019    Procedure: LEFT HEART CATH AND CORONARY ANGIOGRAPHY;  Surgeon: Mady Bruckner, MD;  Location: [facility];  Service: Cardiovascular;  Laterality: N/A;   LUMBAR LAMINECTOMY/DECOMPRESSION MICRODISCECTOMY N/A 11/09/2017   Procedure: LUMBAR LAMINECTOMY/DECOMPRESSION MICRODISCECTOMY 1 LEVEL-4-5;  Surgeon: Clois Fret, MD;  Location: [facility];  Service: Neurosurgery;  Laterality: N/A;   PACEMAKER LEADLESS INSERTION N/A 11/25/2021   Procedure: PACEMAKER LEADLESS INSERTION;  Surgeon: Ammon Blunt, MD;  Location: [facility];  Service: Cardiovascular;  Laterality: N/A;   TEMPORARY PACEMAKER N/A 10/19/2019   Procedure: TEMPORARY PACEMAKER;  Surgeon: Mady Bruckner, MD;  Location: [facility];  Service: Cardiovascular;  Laterality: N/A;    Allergies: Allergies as of 03/20/2024   (No Known Allergies)    Medications: Current Medications[1]  Social History: Social History[2]  Family Medical History: No family history on file.  Physical Examination: Vitals:   03/20/24 1500  BP: 118/84    General: Patient is in no apparent distress. Attention to examination is appropriate.  Neck:   Supple.  Full range of motion.  Respiratory: Patient is breathing without any difficulty.   NEUROLOGICAL:     Awake, alert, oriented to person, place, and time.  Speech is clear and fluent.   Cranial Nerves: Pupils equal round and reactive to light.  Facial tone is symmetric.  Facial sensation is symmetric. Shoulder shrug is symmetric. Tongue protrusion is midline.  There is no pronator drift.  Strength: Side Biceps Triceps Deltoid Interossei Grip Wrist Ext. Wrist Flex.  R 5 5 5  5  5 5 5   L 5 5 5 5 5 5 5    Side Iliopsoas Quads Hamstring PF DF EHL  R 5 5 5 5 5 5   L 5 5 5 5 5 5    Reflexes are 1+ and symmetric at the biceps, triceps, brachioradialis, patella and achilles.   Hoffman's is absent.   Bilateral upper and lower extremity sensation is intact to light  touch.    No evidence of dysmetria noted.  Gait is normal.     Medical Decision Making  Imaging: MRI L spine 03/06/2024  L1-2: No disc bulge, central canal stenosis, or neuroforaminal stenosis.   L2-3: No disc bulge, central canal stenosis, or neuroforaminal stenosis.   L3-4: Circumferential disc bulge with retrolisthesis causes mild bilateral neuroforaminal stenosis. Mild central canal stenosis. Posterior decompression changes.   L4-5: Circumferential disc bulge with ligamentum flavum hypertrophy causes moderate central canal stenosis. Posterior decompression changes at this level. Severe bilateral foraminal narrowing is stable. Mild bilateral facet arthropathy.   L5-S1: Prominent anterior thecal sac with associated moderate to severe narrowing of the thecal sac. No disc bulge or herniation. Moderate bilateral facet arthropathy with right facet joint effusion.   IMPRESSION: 1. 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 2.  Stable chronic anterior wedging at L1, L2, and L3. 3.  Postoperative change within the lumbar spine as described. 4. Stable anterior epidural fat from L4 to S1 with moderate to severe narrowing of the thecal sac posterior to the L4 vertebral body.   Electronically signed by: Venetia Neer MD 03/06/2024 12:47 PM EST RP Workstation: [id number]  I have personally reviewed the images and agree with the above interpretation.  Assessment and Plan: Mr. Zachary Clements is a pleasant 69 y.o. male with postlaminectomy syndrome.  He has no obvious indication to consider spinal surgery.  I think he is an excellent candidate for spinal cord stimulation for postlaminectomy syndrome.  I will send in the appropriate referrals and we will be happy to see him back for permanent placement if he has a successful trial.  I spent a total of 30 minutes in this patient's care today. This time was spent reviewing pertinent records  including imaging studies, obtaining and confirming history, performing a directed evaluation, formulating and discussing my recommendations, and documenting the visit within the medical record.      Thank you for involving me in the care of this patient.      Isra Lindy K. Clois MD, MPHS Neurosurgery     [1]  Current Outpatient Medications:    aspirin  EC 81 MG tablet, Take by mouth., Disp: , Rfl:    atorvastatin  (LIPITOR) 10 MG tablet, Take 10 mg by mouth daily with supper., Disp: , Rfl:    diltiazem (CARDIZEM CD) 180 MG 24 hr capsule, Take 180 mg by mouth daily., Disp: , Rfl:    DULoxetine  (CYMBALTA ) 60 MG capsule, Take 120 mg by mouth daily., Disp: , Rfl:    gabapentin  (NEURONTIN ) 300 MG capsule, Take 300 mg by mouth in the morning, at noon, and at bedtime., Disp: , Rfl:    metoprolol succinate (TOPROL-XL) 25 MG 24 hr tablet, Take 12.5 mg by mouth daily., Disp: , Rfl:    Multiple Vitamin (MULTIVITAMIN WITH MINERALS) TABS tablet, Take 1 tablet by mouth daily., Disp: , Rfl:    naproxen (NAPROSYN) 500 MG tablet, 1 po bid prn, Disp: , Rfl:    oxyCODONE -acetaminophen  (PERCOCET) 7.5-325 MG tablet, Take 1 tablet by mouth 2 (  two) times daily., Disp: , Rfl:    timolol (TIMOPTIC) 0.5 % ophthalmic solution, Place 1 drop into both eyes daily. , Disp: , Rfl:    traZODone  (DESYREL ) 50 MG tablet, Take 1 tablet by mouth at bedtime., Disp: , Rfl:  [2]  Social History Tobacco Use   Smoking status: Former    Current packs/day: 0.00    Types: Cigarettes    Quit date: 11/04/1998    Years since quitting: 25.3   Smokeless tobacco: Never  Vaping Use   Vaping status: Never Used  Substance Use Topics   Alcohol use: Not Currently   "

## 2024-03-20 ENCOUNTER — Ambulatory Visit: Admitting: Neurosurgery

## 2024-03-20 ENCOUNTER — Encounter: Payer: Self-pay | Admitting: Neurosurgery

## 2024-03-20 VITALS — BP 118/84 | Ht 70.0 in | Wt 204.0 lb

## 2024-03-20 DIAGNOSIS — M961 Postlaminectomy syndrome, not elsewhere classified: Secondary | ICD-10-CM | POA: Diagnosis not present

## 2024-03-20 NOTE — Patient Instructions (Signed)
 Zachary Clements - televisits [phone]  Zachary Clements televisits [phone]  [facility] [place] Provider: Duwaine [place], [place] [phone]  Dr Corina in [place] [phone] but is typically booked out about 10 months Dr Achilles in [place] [phone]  [facility] (offices in [place], [place], and [place]) [phone]   Please let us  know which one of the above psychologist's you would like to see for evaluation prior to the spinal cord stimulator trial. These are the only providers we are aware of that perform this type of evaluation. Once we fax the referral, please call them to set up an appointment (they do not typically call you).

## 2024-03-21 ENCOUNTER — Encounter: Payer: Self-pay | Admitting: Neurosurgery

## 2024-03-22 NOTE — Addendum Note (Signed)
 Addended by: Haelie Clapp W on: 03/22/2024 11:50 AM   Modules accepted: Orders

## 2024-05-09 ENCOUNTER — Other Ambulatory Visit (HOSPITAL_COMMUNITY)

## 2024-05-22 ENCOUNTER — Ambulatory Visit: Admitting: Student in an Organized Health Care Education/Training Program

## 2024-07-11 ENCOUNTER — Other Ambulatory Visit

## 2024-07-18 ENCOUNTER — Ambulatory Visit: Admitting: Oncology
